# Patient Record
Sex: Female | Born: 1945 | Race: Black or African American | Hispanic: No | Marital: Single | Smoking: Current every day smoker
Health system: Southern US, Community
[De-identification: ages and names within clinical notes are randomized; demographics above are authoritative.]

## PROBLEM LIST (undated history)

## (undated) DIAGNOSIS — E119 Type 2 diabetes mellitus without complications: Secondary | ICD-10-CM

## (undated) DIAGNOSIS — F32A Depression, unspecified: Secondary | ICD-10-CM

## (undated) DIAGNOSIS — Z21 Asymptomatic human immunodeficiency virus [HIV] infection status: Secondary | ICD-10-CM

## (undated) DIAGNOSIS — I10 Essential (primary) hypertension: Secondary | ICD-10-CM

## (undated) DIAGNOSIS — I2510 Atherosclerotic heart disease of native coronary artery without angina pectoris: Secondary | ICD-10-CM

## (undated) DIAGNOSIS — F1910 Other psychoactive substance abuse, uncomplicated: Secondary | ICD-10-CM

## (undated) DIAGNOSIS — F329 Major depressive disorder, single episode, unspecified: Secondary | ICD-10-CM

## (undated) DIAGNOSIS — E7800 Pure hypercholesterolemia, unspecified: Secondary | ICD-10-CM

## (undated) DIAGNOSIS — B20 Human immunodeficiency virus [HIV] disease: Secondary | ICD-10-CM

## (undated) DIAGNOSIS — F1490 Cocaine use, unspecified, uncomplicated: Secondary | ICD-10-CM

## (undated) HISTORY — PX: OTHER SURGICAL HISTORY: SHX169

## (undated) HISTORY — DX: Other psychoactive substance abuse, uncomplicated: F19.10

---

## 2008-07-25 ENCOUNTER — Other Ambulatory Visit: Payer: Self-pay

## 2008-07-25 ENCOUNTER — Other Ambulatory Visit: Payer: Self-pay | Admitting: Emergency Medicine

## 2008-07-25 ENCOUNTER — Ambulatory Visit: Payer: Self-pay | Admitting: *Deleted

## 2008-07-26 ENCOUNTER — Other Ambulatory Visit: Payer: Self-pay | Admitting: Emergency Medicine

## 2008-07-26 ENCOUNTER — Inpatient Hospital Stay (HOSPITAL_COMMUNITY): Admission: RE | Admit: 2008-07-26 | Discharge: 2008-07-31 | Payer: Self-pay | Admitting: *Deleted

## 2011-03-30 NOTE — H&P (Signed)
Rachael Santana, RAMAKER                ACCOUNT NO.:  [id number]   MEDICAL RECORD NO.:  [id number]          PATIENT TYPE:  IPS   LOCATION:  0307                          FACILITY:  [facility]   PHYSICIAN:  Jasmine Pang, M.D. DATE OF BIRTH:  December 12, 1945   DATE OF ADMISSION:  07/26/2008  DATE OF DISCHARGE:  07/26/2008                       PSYCHIATRIC ADMISSION ASSESSMENT   This is a 65 year old female who was voluntarily admitted.   HISTORY OF PRESENT ILLNESS:  The patient reports a history of  depression.  Depression has been ongoing.  She is feeling very useless  and worthless.  She was having thoughts to jump in front of a car.  She  has not been taking her medications hoping something would happen to  her.  She has no energy, no interest in her activities of daily living,  has not been taking care of her personal hygiene, has not been eating as  she should for her history of diabetes.  She states she has been  thinking about her son who was killed several years ago.  Denies any  substance use.  Urine drug screen was positive for cocaine.   PAST PSYCHIATRIC HISTORY:  First admission to [facility].  Was hospitalized at [facility] approximately 30 years ago for  depression.  Has no current outpatient treatment in place.  Was on  Zoloft in the past.   SOCIAL HISTORY:  A 65 year old divorced female lives in [place] and  lives alone.  Has a daughter in [place] that is supportive.   FAMILY HISTORY:  Is none.   ALCOHOL AND DRUG HISTORY:  She denies any alcohol or drug use.  Again,  urine drug screen was positive for cocaine.   PRIMARY CARE Mahira Petras:  Has none.   MEDICAL PROBLEMS:  Are insulin dependent diabetes mellitus, chronic  pain, HIV positive and hypertension.   MEDICATIONS:  1. Listed is glipizide 10 mg daily.  2. Metformin 1000 mg b.i.d. before dinner and breakfast.  3. Zoloft 50 mg daily.  4. Pravastatin 20 mg daily.  5. Lisinopril 10 mg daily.  6. Novolin insulin 30 units b.i.d.  Again, the patient has been      noncompliant for some period of time.   DRUG ALLERGIES:  PENICILLIN.   PHYSICAL EXAMINATION:  This is a middle-aged woman somewhat overweight.  She was assessed in the emergency department.  She had elevated blood  sugars with initial blood sugar of 623 down to 389 prior to transfer.  The patient did receive IV fluids and regular insulin to control blood  sugars.  Her sodium is 129.  Alcohol level less than 5.  CBC was within  normal limits.  Urine drug screen is positive for cocaine.  Vital signs,  her temperature is 97.5, 72 heart rate, 16 respirations, blood pressure  is 155/88, 5 feet 7-1/2 inches tall, 204 pounds.  She is 96% saturated.  She appears in no acute physical distress.  She denies any complaints of  pain or other symptoms.   MENTAL STATUS EXAM:  The patient was resting in bed, easily awakened and  cooperative for the interview.  Sat on the side of the bed and provided  a good history.  She is casually dressed with good eye contact.  Her  speech is soft-spoken, clear.  The patient's mood is useless and  worthless.  The patient is sad, teary-eyed and tearful throughout the  interview at times.  Thought process are coherent.  Denies any current  suicidal thoughts.  Denies any hallucinations.  Cognitive function, the  patient is aware of herself and situation.  Memory is fair.  Judgment  and insight fair.   AXIS I:  Major depressive disorder, cocaine abuse.  AXIS II:  Deferred.  AXIS III:  Insulin dependent diabetes, hypertension, elevated  cholesterol, chronic pain, HIV positive.  AXIS IV:  Medical problems, psychosocial problems related to son's death  and isolation and lack of support.  AXIS V:  Current is 30.   PLAN:  To stabilize mood and thinking.  We will initiate Celexa, risks  and benefits were discussed with the patient.  The patient is agreeable.  We will have the diabetic coordinator  monitor blood sugars and provide  recommendations for blood sugars.  Will continue to gather more history,  identify supports.  Her tentative length of stay at this time is 4-6  days.      Landry Corporal, N.P.      Jasmine Pang, M.D.  Electronically Signed    JO/MEDQ  D:  07/26/2008  T:  07/27/2008  Job:  [id number]

## 2011-03-30 NOTE — Discharge Summary (Signed)
Rachael Santana, Rachael Santana                ACCOUNT NO.:  [id number]   MEDICAL RECORD NO.:  [id number]          PATIENT TYPE:  IPS   LOCATION:  0307                          FACILITY:  [facility]   PHYSICIAN:  Jasmine Pang, M.D. DATE OF BIRTH:  1946-01-22   DATE OF ADMISSION:  07/26/2008  DATE OF DISCHARGE:  07/31/2008                               DISCHARGE SUMMARY   IDENTIFICATION:  This is a 65 year old divorced African American female,  who was voluntarily admitted on July 26, 2008.   HISTORY OF PRESENT ILLNESS:  The patient reports a history of  depression.  Depression has been ongoing.  She is feeling very useless  and worthless.  She was having thoughts to jump in front of a car.  She  has not been taking her medications hoping something would happened to  her.  She has no energy.  No intracenter activities of daily living.  She has not been taking care for personal hygiene.  She has not been  eating as she should for her history of diabetes.  She states she has  been thinking about her son who was killed several years ago.  She  denies any substance use.  Urine drug screen was positive for cocaine.   PAST PSYCHIATRIC HISTORY:  This is the first admission to [facility].  She was hospitalized at [facility]  approximately 30 years ago for depression.  She has no current  outpatient treatment in place.  She was on Zoloft in the past.   FAMILY HISTORY:  None reported.   ALCOHOL AND DRUG HISTORY:  The patient denies any alcohol or drug use  again urine drug screen was positive for cocaine.   MEDICAL PROBLEMS:  Insulin-dependent diabetes mellitus, chronic pain,  HIV positive, and hypertension.   MEDICATIONS:  1. Glipizide 10 mg daily.  2. Metformin 1000 mg b.i.d. before dinner and breakfast.  3. Zoloft 50 mg daily.  4. Pravastatin 20 mg daily.  5. Lisinopril 10 mg daily.  6. Novolin insulin 30 units b.i.d. (again, the patient has been      noncompliant for  some period of time.)   DRUG ALLERGIES:  PENICILLIN.   PHYSICAL EXAMINATION:  There were no acute physical or medical problems  noted.  The patient was assessed in the emergency department at [facility] ED.   ADMISSION LABORATORIES:  UDS was positive for cocaine, glucose was 389,  alcohol level was less than 5, and sodium was 129.  CBC was within  normal limits.   HOSPITAL COURSE:  Upon admission, the patient was started on trazodone  50 mg p.o. q.h.s. p.r.n. insomnia may repeat x1 if needed.  She was also  started on a moderate sliding scale glycemic control protocol.  She was  restarted on her medicines glipizide 10 mg daily.  Metformin 1000 mg  b.i.d. before breakfast and dinner.  Sertraline 50 mg p.o. daily,  pravastatin 20 mg p.o. daily, lisinopril 10 mg p.o. daily, Novolin  insulin 30 units subcutaneously b.i.d.  She was also started on Lantus  insulin 30 units subcutaneously.  On July 26, 2008, 10 units of  NovoLog insulin was added t.i.d. with each full-meal in addition to the  sliding scale insulin.  The patient's glipizide was discontinued.  She  was started on NPH insulin 10 units subcutaneously daily.  On July 28, 2008, due to severe headache, the patient was started on Fioricet 1  every 6 hours as needed.  When this did not resolve her headache, she  was given a Vicodin 5 mg/325 mg one p.o. now.  On July 30, 2008,  when the headache returned, she was given Percocet 08/324 mg one p.o.  for headache.  She was also given Cipro 250 mg p.o. b.i.d. for 5 days.  In individual sessions with me, the patient was reserved, but  cooperative.  She was quite distressed, tearful, depressed, and anxious.  Speech was soft and slow.  There was psychomotor retardation.  She was  lying in bed much of the day.  She does not feel like attending unit  therapeutic groups and activities at first, this improved as the  hospitalization progressed.  She stated that she had not  been taking her  diabetes medications because she felt like I wanted to slip away.  She  stated she did not see a purpose for her being here.  She was in [facility] 4 years ago, she said for  depression.  As hospitalization progressed, she remained depressed,  anxious, and tearful.  She discussed the death of her son by gunshot in  1997.  She stated I feel empty.  She was not having any side effects  to her medications.  Sleep was good and appetite was good.  By July 29, 2008, mood was less depressed and less anxious.  She felt much  better.  She wanted a family session with her daughter, this was  arranged for July 31, 2008, she began to anticipate going home  after the family session.  The session went well and daughter was very  supportive of the patient, it was felt that the patient was ready for  discharge.  Sleep was good appetite was good.  Mood was euthymic.  Affect consistent with mood.  There was no suicidal or homicidal  ideation.  No thoughts of self-injurious behavior.  No auditory or  visual hallucinations.  No paranoia or delusions.  Thoughts were logical  and goal-directed.  Thought content, no predominant theme.  Cognitive  grossly intact.  Insight good.  Judgment good.  Impulse control was  good.  She was felt to be safe to be discharged and her daughter agreed  with this assessment.   DISCHARGE DIAGNOSES:  Axis I: Major depressive disorder, recurrent  severe and Cocaine abuse.  Axis II: None.  Axis III: Insulin-dependent diabetes mellitus.  Also, hypertension and  elevated cholesterol, chronic pain, and human immunodeficiency virus  positive.  Axis IV: Severe (medical problems, psychosocial problems related to  son's death, and isolation with lack of support.  Burden of psychiatric  illness).  Axis V: Global assessment of functioning was 50 upon discharge.  GAF was  40 upon admission.  GAF highest past year was 65-70.    DISCHARGE PLAN:  There is no specific activity level restriction.  Dietary restrictions were dictated by her primary care physician with  regards to her diabetes.   POSTHOSPITAL CARE PLANS:  The patient will see her primary care  physician Dr. Arther Dames on August 04, 2008, at 9:20 a.m.  She  will  also return to [facility] in [place] for followup medication  management.   DISCHARGE MEDICATIONS:  1. Celexa 20 mg daily.  2. Glucotrol 10 mg at 7:00 a.m. with a meal.  3. Glucophage 500 mg take 1 pill twice a day.  4. Lisinopril 10 mg daily.  5. Lantus 30 units subcutaneously at bedtime.  6. Pravastatin 20 mg daily.  7. Cipro 250 mg p.o. b.i.d. for 5 days.  8. Trazodone 50 mg at bedtime, may repeat for 1.   She is to talk to her doctor she has talked to Dr. Arther Dames about  resuming her Novolin and resuming other medications.  She is to call her  doctor, if she is having problems before her scheduled appointment.      Jasmine Pang, M.D.  Electronically Signed     BHS/MEDQ  D:  07/31/2008  T:  08/01/2008  Job:  [id number]

## 2011-03-30 NOTE — H&P (Signed)
NAMEMARBELLA, MARKGRAF                ACCOUNT NO.:  [id number]   MEDICAL RECORD NO.:  [id number]          PATIENT TYPE:  EMS   LOCATION:                               FACILITY:  [facility]   PHYSICIAN:  Vania Rea, M.D. DATE OF BIRTH:  08/12/1946   DATE OF ADMISSION:  07/25/2008  DATE OF DISCHARGE:                              HISTORY & PHYSICAL   PRIMARY CARE PHYSICIAN:  Unassigned.   CHIEF COMPLAINT:  Uncontrolled diabetes.   HISTORY OF THE PRESENT ILLNESS:  This is a 65 year old African American  lady with a history of diabetes, hypertension and depression who has not  been taking her medications apart from her insulin for about a month  now.  She has become increasingly depressed and was sent from the local  mental health outpatient department to the emergency room because of  suicidal ideation and recommendation for inpatient management.  The  patient was seen and evaluated by the emergency room physician, and is  considered mentally unstable and requires  acute psychiatric care.  However, because the patient's blood sugar was greater than 600 she was  not accepted by the psychiatric facility.  They recommended  stabilization of her blood glucose and monitoring for least 12 hours  prior to admission to a psychiatric facility.  The Hospitalist service  was called to assist with management.   The patient takes oral antidiabetic medications as well as insulin.  She  has continued to take her insulin, but has not been taking her metformin  nor her glipizide as prescribed.  She used to get her treatments out at  the [facility], but she has recently acquired  Medicare and Medicaid; and, because of this she no longer qualifies for  treatment at that clinic, and is looking around trying to find a primary  care physician.   For the past month she has had polyuria, polydipsia and episodic  shortness of breath, which is associated with her mild case of emphysema  she  feels.  She has not been using her inhaler either.  Apart from this,  the patient has had no other symptoms of an acute illness.  Mentally she  has been depressed and has had suicidal thoughts; and, she has been  abusing cocaine as a result.   PAST MEDICAL HISTORY:  1. Diabetes.  2. Hypertension.  3. Depression.  4. Mild emphysema.   MEDICATIONS:  1. NPH insulin 30 units every 12 hours.  2. The patient has been noncompliance with metformin 500 mg twice      daily,  3. Lisinopril 10 mg daily,  4. Pravastatin 20 mg daily,  5. Zoloft 50 mg daily,  6. Glipizide 10 mg daily; and,  7. Albuterol MDI 2 puffs twice daily as needed.   ALLERGIES:  Allergic to PENICILLIN.   SOCIAL HISTORY:  The patient smokes a half-a-pack per day.  Denies  alcohol use.  She has been abusing cocaine.  Her son died in the 107s  and recently he has been dominating her thoughts more and more.   FAMILY HISTORY:  The  patient's family history is significant for  diabetes on her father's side of the family, depression in her mother  and her daughter.   REVIEW OF SYSTEMS:  On review of systems other than that noted above a  10-point review of systems is significant only for bitemporal headaches.   PHYSICAL EXAMINATION:  GENERAL APPEARANCE:  On physical exam this is a  pleasant middle-aged African American lady lying on the stretcher in no  acute distress.  VITAL SIGNS:  Temperature is 98.2, pulse is 72, respirations are 17 and  blood pressure is 153/90.  She is saturating at 98% on room air.  GENERAL:  In general she is in no pain.  HEENT:  Pupils are round and equal.  Mucous membranes pink and  anicteric.  She looks somewhat dehydrated and has some perleche.  There  is no oral Candida.  NECK:  The neck shows no cervical lymphadenopathy, thyromegaly or  jugular venous distention.  CHEST:  The patient's chest is clear to auscultation bilaterally.  HEART:  Cardiovascular system reveals regular rhythm without  murmur.  ABDOMEN:  The patient's abdomen is obese, soft and nontender.  EXTREMITIES:  The extremities are without edema.  She has 2+ pulses  bilaterally.  NEUROLOGIC EXAMINATION:  The central nervous system shows that cranial  nerves II-XII are grossly intact.  She has no focal neurological  deficit.  SKIN:  On the skin she has a healing, scaly rash on both hands and  elbows as well as her feet for which she states she has been using a  cream, but cannot remember the name of it.   LABORATORY DATA:  The patient's CBC is unremarkable.  Her serum  chemistries are remarkable for a sodium of 129, potassium of 4.3,  chloride of 96, CO2 of 26, BUN of 20, creatinine of 1.0, and glucose of  623.  Alcohol level was undetectable.  Urine drug screen was positive  for cocaine.  The patient had been on a glucommander in the emergency  room and her blood sugar at this time is now decreased to 146.   ASSESSMENT:  1. Depression with suicidal ideation.  2. Diabetes Type 2, uncontrolled.  3. History of chronic obstructive pulmonary disease, stable.  4. History of hyperlipidemia, unknown control.  5. Hypertension, uncontrolled.   PLAN:  1. Can bring this lady in on observation and for hydration, if this      process is not completed in the ED.  2. We will restart her insulin at a higher dose than she has been      taking.  3. We will also restart her metformin.  4. As soon as she is Stable she can be transferred to [facility].      Vania Rea, M.D.  Electronically Signed     LC/MEDQ  D:  07/26/2008  T:  07/26/2008  Job:  [id number]

## 2011-08-16 LAB — GLUCOSE, CAPILLARY: Glucose-Capillary: 207 — ABNORMAL HIGH

## 2011-08-18 LAB — URINALYSIS, ROUTINE W REFLEX MICROSCOPIC
Nitrite: NEGATIVE
Protein, ur: NEGATIVE
Specific Gravity, Urine: 1.024
Urobilinogen, UA: 0.2

## 2011-08-18 LAB — PREGNANCY, URINE: Preg Test, Ur: NEGATIVE

## 2011-08-18 LAB — DIFFERENTIAL
Basophils Relative: 0
Eosinophils Absolute: 0
Eosinophils Relative: 0
Lymphs Abs: 1.2
Neutrophils Relative %: 68

## 2011-08-18 LAB — GLUCOSE, CAPILLARY
Glucose-Capillary: 101 — ABNORMAL HIGH
Glucose-Capillary: 146 — ABNORMAL HIGH
Glucose-Capillary: 213 — ABNORMAL HIGH
Glucose-Capillary: 227 — ABNORMAL HIGH
Glucose-Capillary: 230 — ABNORMAL HIGH
Glucose-Capillary: 250 — ABNORMAL HIGH
Glucose-Capillary: 283 — ABNORMAL HIGH
Glucose-Capillary: 316 — ABNORMAL HIGH
Glucose-Capillary: 389 — ABNORMAL HIGH
Glucose-Capillary: 406 — ABNORMAL HIGH
Glucose-Capillary: 410 — ABNORMAL HIGH
Glucose-Capillary: 499 — ABNORMAL HIGH

## 2011-08-18 LAB — POCT I-STAT, CHEM 8
Creatinine, Ser: 1
HCT: 39
Hemoglobin: 13.3
Potassium: 4.3
Sodium: 129 — ABNORMAL LOW
TCO2: 26

## 2011-08-18 LAB — CBC
HCT: 39.4
MCHC: 33.1
MCV: 87.7
Platelets: 216
RDW: 13.7
WBC: 4.7

## 2011-08-18 LAB — BASIC METABOLIC PANEL
BUN: 15
Calcium: 8.9
Creatinine, Ser: 0.78
GFR calc non Af Amer: >60
Glucose, Bld: 339 — ABNORMAL HIGH
Sodium: 134 — ABNORMAL LOW

## 2011-08-18 LAB — RAPID URINE DRUG SCREEN, HOSP PERFORMED
Amphetamines: NOT DETECTED
Barbiturates: NOT DETECTED
Benzodiazepines: NOT DETECTED
Cocaine: POSITIVE — AB
Opiates: NOT DETECTED

## 2011-08-18 LAB — TSH: TSH: 0.65

## 2011-08-18 LAB — URINE MICROSCOPIC-ADD ON

## 2013-09-06 ENCOUNTER — Encounter (HOSPITAL_COMMUNITY): Payer: Self-pay | Admitting: Emergency Medicine

## 2013-09-06 ENCOUNTER — Emergency Department (HOSPITAL_COMMUNITY): Payer: Medicare Other

## 2013-09-06 ENCOUNTER — Observation Stay (HOSPITAL_COMMUNITY)
Admission: EM | Admit: 2013-09-06 | Discharge: 2013-09-07 | Disposition: A | Discharge status: Home / Self Care | Payer: Medicare Other | Attending: Emergency Medicine | Admitting: Emergency Medicine

## 2013-09-06 DIAGNOSIS — R0789 Other chest pain: Principal | ICD-10-CM | POA: Insufficient documentation

## 2013-09-06 DIAGNOSIS — Z79899 Other long term (current) drug therapy: Secondary | ICD-10-CM | POA: Insufficient documentation

## 2013-09-06 DIAGNOSIS — E7800 Pure hypercholesterolemia, unspecified: Secondary | ICD-10-CM | POA: Diagnosis present

## 2013-09-06 DIAGNOSIS — R0600 Dyspnea, unspecified: Secondary | ICD-10-CM

## 2013-09-06 DIAGNOSIS — I2510 Atherosclerotic heart disease of native coronary artery without angina pectoris: Secondary | ICD-10-CM

## 2013-09-06 DIAGNOSIS — Z21 Asymptomatic human immunodeficiency virus [HIV] infection status: Secondary | ICD-10-CM | POA: Insufficient documentation

## 2013-09-06 DIAGNOSIS — Z9861 Coronary angioplasty status: Secondary | ICD-10-CM | POA: Insufficient documentation

## 2013-09-06 DIAGNOSIS — E119 Type 2 diabetes mellitus without complications: Secondary | ICD-10-CM | POA: Insufficient documentation

## 2013-09-06 DIAGNOSIS — F329 Major depressive disorder, single episode, unspecified: Secondary | ICD-10-CM | POA: Insufficient documentation

## 2013-09-06 DIAGNOSIS — R0602 Shortness of breath: Secondary | ICD-10-CM | POA: Insufficient documentation

## 2013-09-06 DIAGNOSIS — F3289 Other specified depressive episodes: Secondary | ICD-10-CM | POA: Insufficient documentation

## 2013-09-06 DIAGNOSIS — Z7902 Long term (current) use of antithrombotics/antiplatelets: Secondary | ICD-10-CM | POA: Insufficient documentation

## 2013-09-06 DIAGNOSIS — I10 Essential (primary) hypertension: Secondary | ICD-10-CM | POA: Diagnosis present

## 2013-09-06 DIAGNOSIS — B20 Human immunodeficiency virus [HIV] disease: Secondary | ICD-10-CM

## 2013-09-06 DIAGNOSIS — Z880 Allergy status to penicillin: Secondary | ICD-10-CM | POA: Insufficient documentation

## 2013-09-06 DIAGNOSIS — F17200 Nicotine dependence, unspecified, uncomplicated: Secondary | ICD-10-CM | POA: Insufficient documentation

## 2013-09-06 DIAGNOSIS — Z23 Encounter for immunization: Secondary | ICD-10-CM | POA: Insufficient documentation

## 2013-09-06 DIAGNOSIS — M25569 Pain in unspecified knee: Secondary | ICD-10-CM | POA: Insufficient documentation

## 2013-09-06 DIAGNOSIS — R079 Chest pain, unspecified: Secondary | ICD-10-CM | POA: Diagnosis present

## 2013-09-06 DIAGNOSIS — Z794 Long term (current) use of insulin: Secondary | ICD-10-CM | POA: Insufficient documentation

## 2013-09-06 DIAGNOSIS — E1140 Type 2 diabetes mellitus with diabetic neuropathy, unspecified: Secondary | ICD-10-CM

## 2013-09-06 DIAGNOSIS — R110 Nausea: Secondary | ICD-10-CM | POA: Insufficient documentation

## 2013-09-06 HISTORY — DX: Type 2 diabetes mellitus without complications: E11.9

## 2013-09-06 HISTORY — DX: Cocaine use, unspecified, uncomplicated: F14.90

## 2013-09-06 HISTORY — DX: Atherosclerotic heart disease of native coronary artery without angina pectoris: I25.10

## 2013-09-06 HISTORY — DX: Essential (primary) hypertension: I10

## 2013-09-06 HISTORY — DX: Pure hypercholesterolemia, unspecified: E78.00

## 2013-09-06 HISTORY — DX: Human immunodeficiency virus (HIV) disease: B20

## 2013-09-06 HISTORY — DX: Major depressive disorder, single episode, unspecified: F32.9

## 2013-09-06 HISTORY — DX: Asymptomatic human immunodeficiency virus (hiv) infection status: Z21

## 2013-09-06 HISTORY — DX: Depression, unspecified: F32.A

## 2013-09-06 LAB — CBC WITH DIFFERENTIAL/PLATELET
Basophils Absolute: 0 10*3/uL (ref 0.0–0.1)
Eosinophils Absolute: 0 10*3/uL (ref 0.0–0.7)
Eosinophils Relative: 1 % (ref 0–5)
MCH: 31.5 pg (ref 26.0–34.0)
MCV: 93.1 fL (ref 78.0–100.0)
Neutrophils Relative %: 64 % (ref 43–77)
Platelets: 262 10*3/uL (ref 150–400)
RDW: 13.6 % (ref 11.5–15.5)
WBC: 5.6 10*3/uL (ref 4.0–10.5)

## 2013-09-06 LAB — COMPREHENSIVE METABOLIC PANEL
ALT: 11 U/L (ref 0–35)
AST: 14 U/L (ref 0–37)
Albumin: 3 g/dL — ABNORMAL LOW (ref 3.5–5.2)
Calcium: 9.2 mg/dL (ref 8.4–10.5)
GFR calc Af Amer: 41 mL/min — ABNORMAL LOW (ref >90)
Potassium: 4.4 mEq/L (ref 3.5–5.1)
Sodium: 137 mEq/L (ref 135–145)
Total Protein: 7.6 g/dL (ref 6.0–8.3)

## 2013-09-06 MED ORDER — AMITRIPTYLINE HCL 25 MG PO TABS
25.0000 mg | ORAL_TABLET | Freq: Every day | ORAL | Status: DC
  Administered 2013-09-07: 25 mg via ORAL
  Filled 2013-09-06 (×2): qty 1

## 2013-09-06 MED ORDER — OXYCODONE-ACETAMINOPHEN 5-325 MG PO TABS
1.0000 | ORAL_TABLET | Freq: Once | ORAL | Status: AC
  Administered 2013-09-06: 1 via ORAL
  Filled 2013-09-06: qty 1

## 2013-09-06 MED ORDER — ENOXAPARIN SODIUM 120 MG/0.8ML ~~LOC~~ SOLN
1.0000 mg/kg | Freq: Two times a day (BID) | SUBCUTANEOUS | Status: DC
  Administered 2013-09-07: 105 mg via SUBCUTANEOUS
  Filled 2013-09-06 (×3): qty 0.8

## 2013-09-06 MED ORDER — RITONAVIR 100 MG PO CAPS
100.0000 mg | ORAL_CAPSULE | Freq: Every day | ORAL | Status: DC
  Administered 2013-09-07: 100 mg via ORAL
  Filled 2013-09-06 (×2): qty 1

## 2013-09-06 MED ORDER — DARUNAVIR ETHANOLATE 800 MG PO TABS
800.0000 mg | ORAL_TABLET | Freq: Every day | ORAL | Status: DC
  Administered 2013-09-07: 800 mg via ORAL
  Filled 2013-09-06 (×2): qty 1

## 2013-09-06 MED ORDER — SODIUM CHLORIDE 0.9 % IV SOLN: 250.0000 mL | INTRAVENOUS | Status: DC | PRN

## 2013-09-06 MED ORDER — SODIUM CHLORIDE 0.9 % IJ SOLN
3.0000 mL | Freq: Two times a day (BID) | INTRAMUSCULAR | Status: DC
  Administered 2013-09-07: 3 mL via INTRAVENOUS

## 2013-09-06 MED ORDER — GABAPENTIN 300 MG PO CAPS
600.0000 mg | ORAL_CAPSULE | Freq: Three times a day (TID) | ORAL | Status: DC
  Administered 2013-09-07 (×2): 600 mg via ORAL
  Filled 2013-09-06 (×4): qty 2

## 2013-09-06 MED ORDER — INSULIN ASPART 100 UNIT/ML ~~LOC~~ SOLN: 0.0000 [IU] | Freq: Three times a day (TID) | SUBCUTANEOUS | Status: DC

## 2013-09-06 MED ORDER — SIMVASTATIN 40 MG PO TABS
40.0000 mg | ORAL_TABLET | Freq: Every day | ORAL | Status: DC
  Filled 2013-09-06: qty 1

## 2013-09-06 MED ORDER — ONDANSETRON HCL 4 MG/2ML IJ SOLN: 4.0000 mg | Freq: Four times a day (QID) | INTRAMUSCULAR | Status: DC | PRN

## 2013-09-06 MED ORDER — FUROSEMIDE 20 MG PO TABS
20.0000 mg | ORAL_TABLET | Freq: Every day | ORAL | Status: DC
  Administered 2013-09-07: 20 mg via ORAL
  Filled 2013-09-06: qty 1

## 2013-09-06 MED ORDER — INSULIN GLARGINE 100 UNIT/ML ~~LOC~~ SOLN
60.0000 [IU] | Freq: Two times a day (BID) | SUBCUTANEOUS | Status: DC
  Administered 2013-09-07: 60 [IU] via SUBCUTANEOUS
  Filled 2013-09-06 (×3): qty 0.6

## 2013-09-06 MED ORDER — CARVEDILOL 25 MG PO TABS
25.0000 mg | ORAL_TABLET | Freq: Two times a day (BID) | ORAL | Status: DC
  Administered 2013-09-07: 25 mg via ORAL
  Filled 2013-09-06 (×3): qty 1

## 2013-09-06 MED ORDER — SODIUM CHLORIDE 0.9 % IJ SOLN: 3.0000 mL | INTRAMUSCULAR | Status: DC | PRN

## 2013-09-06 MED ORDER — BUDESONIDE-FORMOTEROL FUMARATE 160-4.5 MCG/ACT IN AERO: 2.0000 | INHALATION_SPRAY | Freq: Two times a day (BID) | RESPIRATORY_TRACT | Status: DC | PRN

## 2013-09-06 MED ORDER — ALUM & MAG HYDROXIDE-SIMETH 200-200-20 MG/5ML PO SUSP
30.0000 mL | Freq: Four times a day (QID) | ORAL | Status: DC | PRN
  Administered 2013-09-06: 30 mL via ORAL
  Filled 2013-09-06: qty 30

## 2013-09-06 MED ORDER — ASPIRIN 81 MG PO CHEW
324.0000 mg | CHEWABLE_TABLET | Freq: Once | ORAL | Status: AC
  Administered 2013-09-06: 324 mg via ORAL
  Filled 2013-09-06: qty 4

## 2013-09-06 MED ORDER — EMTRICITABINE-TENOFOVIR DF 200-300 MG PO TABS
1.0000 | ORAL_TABLET | Freq: Every day | ORAL | Status: DC
  Administered 2013-09-07: 1 via ORAL
  Filled 2013-09-06: qty 1

## 2013-09-06 MED ORDER — CHLORTHALIDONE 25 MG PO TABS
25.0000 mg | ORAL_TABLET | Freq: Every day | ORAL | Status: DC
  Administered 2013-09-07: 25 mg via ORAL
  Filled 2013-09-06: qty 1

## 2013-09-06 MED ORDER — MORPHINE SULFATE 2 MG/ML IJ SOLN
1.0000 mg | INTRAMUSCULAR | Status: DC | PRN
  Administered 2013-09-06: 1 mg via INTRAVENOUS
  Filled 2013-09-06: qty 1

## 2013-09-06 MED ORDER — CLOPIDOGREL BISULFATE 75 MG PO TABS
75.0000 mg | ORAL_TABLET | Freq: Every day | ORAL | Status: DC
  Administered 2013-09-07: 75 mg via ORAL
  Filled 2013-09-06: qty 1

## 2013-09-06 MED ORDER — NITROGLYCERIN 0.4 MG SL SUBL
0.4000 mg | SUBLINGUAL_TABLET | SUBLINGUAL | Status: AC | PRN
  Administered 2013-09-06 (×3): 0.4 mg via SUBLINGUAL

## 2013-09-06 MED ORDER — ALBUTEROL SULFATE HFA 108 (90 BASE) MCG/ACT IN AERS: 2.0000 | INHALATION_SPRAY | RESPIRATORY_TRACT | Status: DC | PRN

## 2013-09-06 MED ORDER — ONDANSETRON HCL 4 MG PO TABS: 4.0000 mg | ORAL_TABLET | Freq: Four times a day (QID) | ORAL | Status: DC | PRN

## 2013-09-06 MED ORDER — GLIPIZIDE ER 10 MG PO TB24
10.0000 mg | ORAL_TABLET | Freq: Every day | ORAL | Status: DC
  Filled 2013-09-06 (×2): qty 1

## 2013-09-06 MED ORDER — RAMIPRIL 10 MG PO CAPS
10.0000 mg | ORAL_CAPSULE | Freq: Every day | ORAL | Status: DC
  Administered 2013-09-07: 10 mg via ORAL
  Filled 2013-09-06: qty 1

## 2013-09-06 NOTE — H&P (Signed)
PCP:   TALBOT, Chivas Notz C, MD   Chief Complaint:  Chest pain  HPI: 67 yo female h/o hiv, cad s/p stent x 2 1.5 years ago, htn, dm, hld comes in with 3 days of waxing/waning sscp with radiation down left arm.  This feels similar to her previous mi event.  No fevers.  No cough.  Some associated sob and nausea but no vomiting.  Not relieved or worse with eating.  Relieved witih asa and ntg given earlier.  Cp free now.  No le edema or swelling.  She has been hiv pos for over 10 years with undetectable viral load for many many years per her report.  Review of Systems:  Positive and negative as per HPI otherwise all other systems are negative  Past Medical History: Past Medical History  Diagnosis Date  . Hypertension   . Diabetes mellitus without complication   . HIV (human immunodeficiency virus infection)   . High cholesterol   . Depression    Past Surgical History  Procedure Laterality Date  . Stents      Medications: Prior to Admission medications   Medication Sig Start Date End Date Taking? Authorizing Provider  amitriptyline (ELAVIL) 25 MG tablet Take 25 mg by mouth at bedtime.  08/23/13  Yes Historical Provider, MD  carvedilol (COREG) 25 MG tablet Take 25 mg by mouth 2 (two) times daily with a meal.  08/23/13  Yes Historical Provider, MD  chlorthalidone (HYGROTON) 25 MG tablet Take 25 mg by mouth daily.  08/23/13  Yes Historical Provider, MD  clopidogrel (PLAVIX) 75 MG tablet Take 75 mg by mouth daily.  09/03/13  Yes Historical Provider, MD  emtricitabine-tenofovir (TRUVADA) 200-300 MG per tablet Take 1 tablet by mouth daily.   Yes Historical Provider, MD  furosemide (LASIX) 20 MG tablet Take 20 mg by mouth daily.  08/23/13  Yes Historical Provider, MD  gabapentin (NEURONTIN) 300 MG capsule Take 600 mg by mouth 3 (three) times daily.  08/23/13  Yes Historical Provider, MD  GLIPIZIDE XL 10 MG 24 hr tablet Take 10 mg by mouth daily.  08/31/13  Yes Historical Provider, MD  JANUVIA 100 MG  tablet Take 100 mg by mouth daily.  08/23/13  Yes Historical Provider, MD  LANTUS 100 UNIT/ML injection Inject 60 Units into the skin 2 (two) times daily.  08/23/13  Yes Historical Provider, MD  pravastatin (PRAVACHOL) 80 MG tablet Take 80 mg by mouth daily.  08/23/13  Yes Historical Provider, MD  PREZISTA 800 MG tablet Take 800 mg by mouth daily with breakfast.  08/23/13  Yes Historical Provider, MD  PROAIR HFA 108 (90 BASE) MCG/ACT inhaler Inhale 2 puffs into the lungs every 4 (four) hours as needed for wheezing or shortness of breath.  08/23/13  Yes Historical Provider, MD  ramipril (ALTACE) 10 MG capsule Take 10 mg by mouth daily.  08/23/13  Yes Historical Provider, MD  ritonavir (NORVIR) 100 MG capsule Take 100 mg by mouth daily.    Yes Historical Provider, MD  SYMBICORT 160-4.5 MCG/ACT inhaler Inhale 2 puffs into the lungs 2 (two) times daily as needed (shortness of breath).  08/23/13  Yes Historical Provider, MD    Allergies:   Allergies  Allergen Reactions  . Penicillins Hives    Social History:  reports that she has quit smoking. She does not have any smokeless tobacco history on file. Her alcohol and drug histories are not on file.  Family History: History reviewed. No pertinent family history.  Physical Exam: Filed Vitals:   09/06/13 2100 09/06/13 2130 09/06/13 2145 09/06/13 2215  BP: 163/64 182/68 173/64 163/66  Pulse: 72 67 67 71  Temp:      TempSrc:      Resp: 13 16 15 17   Height:      Weight:      SpO2: 100% 100% 100% 100%   General appearance: alert, cooperative and no distress Head: Normocephalic, without obvious abnormality, atraumatic Eyes: negative Nose: Nares normal. Septum midline. Mucosa normal. No drainage or sinus tenderness. Neck: no JVD and supple, symmetrical, trachea midline Lungs: clear to auscultation bilaterally Heart: regular rate and rhythm, S1, S2 normal, no murmur, click, rub or gallop chest pain reproducible with palp left anterior chest  wall Abdomen: soft, non-tender; bowel sounds normal; no masses,  no organomegaly Extremities: extremities normal, atraumatic, no cyanosis or edema Pulses: 2+ and symmetric Skin: Skin color, texture, turgor normal. No rashes or lesions Neurologic: Grossly normal   Labs on Admission:   Recent Labs  09/06/13 2008  NA 137  K 4.4  CL 97  CO2 30  GLUCOSE 254*  BUN 17  CREATININE 1.50*  CALCIUM 9.2    Recent Labs  09/06/13 2008  AST 14  ALT 11  ALKPHOS 77  BILITOT 0.1*  PROT 7.6  ALBUMIN 3.0*    Recent Labs  09/06/13 2008  WBC 5.6  NEUTROABS 3.6  HGB 13.7  HCT 40.5  MCV 93.1  PLT 262   Radiological Exams on Admission: Dg Chest 2 View  09/06/2013   CLINICAL DATA:  Left-sided chest pain.  EXAM: CHEST  2 VIEW  COMPARISON:  None available.  FINDINGS: The cardiac silhouette, mediastinal and hilar contours are within normal limits. The lungs are clear except for streaky bibasilar atelectasis. No pleural effusion. Moderate eventration of the left hemidiaphragm is noted. The bony thorax is intact.  IMPRESSION: Streaky bibasilar atelectasis but no infiltrates or effusions.   Electronically Signed   By: Loralie Champagne M.D.   On: 09/06/2013 20:48    Assessment/Plan  67 yo female chest pain with atycial and typical features with h/o CAD s/p stenting  Principal Problem:   Chest pain-  Cp free now.  obs on tele.  Romi.  Ck echo in am.  She did not have her cath here in 2013.  If cp recurs, place ntg paste.  Give full dose of lovenox x 1 until rules out.  Active Problems:   Hypertension   HIV (human immunodeficiency virus infection)   Diabetes mellitus without complication   High cholesterol   CAD S/P percutaneous coronary angioplasty    Mika Griffitts A 09/06/2013, 10:25 PM

## 2013-09-06 NOTE — ED Provider Notes (Signed)
CSN: [id number]     Arrival date & time 09/06/13  1950 History   First MD Initiated Contact with Patient 09/06/13 2031     Chief Complaint  Patient presents with  . Chest Pain  . Knee Pain    Patient is a 67 y.o. female presenting with chest pain and knee pain. The history is provided by the patient.  Chest Pain Pain location:  L chest Pain quality: pressure   Pain radiates to:  L shoulder Pain radiates to the back: no   Pain severity:  Moderate Onset quality:  Gradual Duration:  2 days Timing:  Constant Progression:  Worsening Chronicity:  New Relieved by:  None tried Worsened by:  Nothing tried Associated symptoms: nausea and shortness of breath   Associated symptoms: no fever and not vomiting   Knee Pain Associated symptoms: no fever     Past Medical History  Diagnosis Date  . Hypertension   . Diabetes mellitus without complication   . HIV (human immunodeficiency virus infection)   . High cholesterol   . Depression    Past Surgical History  Procedure Laterality Date  . Stents     History reviewed. No pertinent family history. History  Substance Use Topics  . Smoking status: Former Games developer  . Smokeless tobacco: Not on file  . Alcohol Use: Not on file   OB History   Grav Para Term Preterm Abortions TAB SAB Ect Mult Living                 Review of Systems  Constitutional: Negative for fever.  Respiratory: Positive for shortness of breath.   Cardiovascular: Positive for chest pain.  Gastrointestinal: Positive for nausea. Negative for vomiting and diarrhea.  Neurological: Negative for syncope.  All other systems reviewed and are negative.    Allergies  Penicillins  Home Medications   Current Outpatient Rx  Name  Route  Sig  Dispense  Refill  . amitriptyline (ELAVIL) 25 MG tablet   Oral   Take 25 mg by mouth at bedtime.          . carvedilol (COREG) 25 MG tablet   Oral   Take 25 mg by mouth 2 (two) times daily with a meal.          .  chlorthalidone (HYGROTON) 25 MG tablet   Oral   Take 25 mg by mouth daily.          . clopidogrel (PLAVIX) 75 MG tablet   Oral   Take 75 mg by mouth daily.          Marland Kitchen emtricitabine-tenofovir (TRUVADA) 200-300 MG per tablet   Oral   Take 1 tablet by mouth daily.         . furosemide (LASIX) 20 MG tablet   Oral   Take 20 mg by mouth daily.          Marland Kitchen gabapentin (NEURONTIN) 300 MG capsule   Oral   Take 600 mg by mouth 3 (three) times daily.          Marland Kitchen GLIPIZIDE XL 10 MG 24 hr tablet   Oral   Take 10 mg by mouth daily.          Marland Kitchen JANUVIA 100 MG tablet   Oral   Take 100 mg by mouth daily.          Marland Kitchen LANTUS 100 UNIT/ML injection   Subcutaneous   Inject 60 Units into the skin 2 (two) times daily.          Marland Kitchen  pravastatin (PRAVACHOL) 80 MG tablet   Oral   Take 80 mg by mouth daily.          Marland Kitchen PREZISTA 800 MG tablet   Oral   Take 800 mg by mouth daily with breakfast.          . PROAIR HFA 108 (90 BASE) MCG/ACT inhaler   Inhalation   Inhale 2 puffs into the lungs every 4 (four) hours as needed for wheezing or shortness of breath.          . ramipril (ALTACE) 10 MG capsule   Oral   Take 10 mg by mouth daily.          . ritonavir (NORVIR) 100 MG capsule   Oral   Take 100 mg by mouth daily.          . SYMBICORT 160-4.5 MCG/ACT inhaler   Inhalation   Inhale 2 puffs into the lungs 2 (two) times daily as needed (shortness of breath).           BP 178/69  Pulse 74  Temp(Src) 98.9 F (37.2 C) (Oral)  Resp 16  Ht 5\' 5"  (1.651 m)  Wt 235 lb (106.595 kg)  BMI 39.11 kg/m2  SpO2 98% Physical Exam CONSTITUTIONAL: Well developed/well nourished HEAD: Normocephalic/atraumatic EYES: EOMI/PERRL ENMT: Mucous membranes moist NECK: supple no meningeal signs SPINE:entire spine nontender CV: S1/S2 noted, no murmurs/rubs/gallops noted LUNGS: Lungs are clear to auscultation bilaterally, no apparent distress Chest-  No significant chest wall  tenderness ABDOMEN: soft, nontender, no rebound or guarding GU:no cva tenderness NEURO: Pt is awake/alert, moves all extremitiesx4 EXTREMITIES: pulses normal, full ROM. Mild tenderness to left knee, but no signs of trauma or deformity (pt reports h/o arthritis in that leg) SKIN: warm, color normal PSYCH: no abnormalities of mood noted  ED Course  Procedures (including critical care time) Labs Review Labs Reviewed  COMPREHENSIVE METABOLIC PANEL - Abnormal; Notable for the following:    Glucose, Bld 254 (*)    Creatinine, Ser 1.50 (*)    Albumin 3.0 (*)    Total Bilirubin 0.1 (*)    GFR calc non Af Amer 35 (*)    GFR calc Af Amer 41 (*)    All other components within normal limits  CBC WITH DIFFERENTIAL  POCT I-STAT TROPONIN I   Imaging Review Dg Chest 2 View  09/06/2013   CLINICAL DATA:  Left-sided chest pain.  EXAM: CHEST  2 VIEW  COMPARISON:  None available.  FINDINGS: The cardiac silhouette, mediastinal and hilar contours are within normal limits. The lungs are clear except for streaky bibasilar atelectasis. No pleural effusion. Moderate eventration of the left hemidiaphragm is noted. The bony thorax is intact.  IMPRESSION: Streaky bibasilar atelectasis but no infiltrates or effusions.   Electronically Signed   By: Loralie Champagne M.D.   On: 09/06/2013 20:48    EKG Interpretation     Ventricular Rate:  75 PR Interval:  152 QRS Duration: 78 QT Interval:  422 QTC Calculation: 471 R Axis:   7 Text Interpretation:  Normal sinus rhythm Inferior infarct , age undetermined Anterior infarct , age undetermined Abnormal ECG No previous ECGs available Poor data quality nml intervals            MDM  No diagnosis found. Nursing notes including past medical history and social history reviewed and considered in documentation xrays reviewed and considered Labs/vital reviewed and considered   9:49 PM Pt with h/o CAD with stent placement in 2013  in [facility] She reports this  feels similar to prior episodes of cardiac CP   10:41 PM Pt reports she is now CP free Will admit to medicine D/w dr Onalee Hua, will admit  Joya Gaskins, MD 09/06/13 2241

## 2013-09-06 NOTE — ED Notes (Signed)
Pt states that yesterday she was having mild CP. Pt states today the pain got worse radiated down her right shoulder and left side of her chest. Pt states that she has had 2 stents placed and the pain today feels similar to when she had her last stents placed. Pt states that she also has althritis in both knees and her left knee has been hurting more than usual. Pt wants CP and knee pain checked out today.

## 2013-09-07 ENCOUNTER — Encounter (HOSPITAL_COMMUNITY): Payer: Self-pay | Admitting: Nurse Practitioner

## 2013-09-07 DIAGNOSIS — E1149 Type 2 diabetes mellitus with other diabetic neurological complication: Secondary | ICD-10-CM

## 2013-09-07 DIAGNOSIS — E7800 Pure hypercholesterolemia, unspecified: Secondary | ICD-10-CM

## 2013-09-07 DIAGNOSIS — R0609 Other forms of dyspnea: Secondary | ICD-10-CM

## 2013-09-07 DIAGNOSIS — R079 Chest pain, unspecified: Secondary | ICD-10-CM

## 2013-09-07 DIAGNOSIS — E1142 Type 2 diabetes mellitus with diabetic polyneuropathy: Secondary | ICD-10-CM

## 2013-09-07 DIAGNOSIS — I10 Essential (primary) hypertension: Secondary | ICD-10-CM

## 2013-09-07 LAB — BASIC METABOLIC PANEL
CO2: 31 mEq/L (ref 19–32)
Chloride: 100 mEq/L (ref 96–112)
Creatinine, Ser: 1.35 mg/dL — ABNORMAL HIGH (ref 0.50–1.10)
Glucose, Bld: 74 mg/dL (ref 70–99)

## 2013-09-07 LAB — TROPONIN I
Troponin I: <0.3 ng/mL (ref <0.30)
Troponin I: <0.3 ng/mL (ref <0.30)

## 2013-09-07 LAB — CBC
HCT: 38 % (ref 36.0–46.0)
MCH: 30.9 pg (ref 26.0–34.0)
MCHC: 33.2 g/dL (ref 30.0–36.0)
MCV: 93.1 fL (ref 78.0–100.0)
Platelets: 237 10*3/uL (ref 150–400)
RBC: 4.08 MIL/uL (ref 3.87–5.11)
RDW: 13.7 % (ref 11.5–15.5)
WBC: 5.3 10*3/uL (ref 4.0–10.5)

## 2013-09-07 LAB — GLUCOSE, CAPILLARY
Glucose-Capillary: 182 mg/dL — ABNORMAL HIGH (ref 70–99)
Glucose-Capillary: 58 mg/dL — ABNORMAL LOW (ref 70–99)
Glucose-Capillary: 98 mg/dL (ref 70–99)
Glucose-Capillary: 108 mg/dL — ABNORMAL HIGH (ref 70–99)

## 2013-09-07 LAB — HEPATIC FUNCTION PANEL
AST: 12 U/L (ref 0–37)
Alkaline Phosphatase: 70 U/L (ref 39–117)
Total Bilirubin: 0.1 mg/dL — ABNORMAL LOW (ref 0.3–1.2)

## 2013-09-07 LAB — LIPASE, BLOOD: Lipase: 30 U/L (ref 11–59)

## 2013-09-07 MED ORDER — INSULIN GLARGINE 100 UNIT/ML ~~LOC~~ SOLN
30.0000 [IU] | Freq: Two times a day (BID) | SUBCUTANEOUS | Status: DC
  Filled 2013-09-07: qty 0.3

## 2013-09-07 MED ORDER — OXYCODONE-ACETAMINOPHEN 5-325 MG PO TABS: 1.0000 | ORAL_TABLET | Freq: Once | ORAL | Status: DC

## 2013-09-07 MED ORDER — HEPARIN SODIUM (PORCINE) 5000 UNIT/ML IJ SOLN
5000.0000 [IU] | Freq: Three times a day (TID) | INTRAMUSCULAR | Status: DC
  Filled 2013-09-07: qty 1

## 2013-09-07 MED ORDER — PRAVASTATIN SODIUM 40 MG PO TABS
80.0000 mg | ORAL_TABLET | Freq: Every day | ORAL | Status: DC
  Filled 2013-09-07: qty 2

## 2013-09-07 MED ORDER — OMEPRAZOLE 20 MG PO CPDR: 20.0000 mg | DELAYED_RELEASE_CAPSULE | Freq: Every day | ORAL | Status: DC

## 2013-09-07 MED ORDER — AMLODIPINE BESYLATE 5 MG PO TABS: 5.0000 mg | ORAL_TABLET | Freq: Every day | ORAL | Status: DC

## 2013-09-07 MED ORDER — INFLUENZA VAC SPLIT QUAD 0.5 ML IM SUSP
0.5000 mL | INTRAMUSCULAR | Status: AC
  Administered 2013-09-07: 0.5 mL via INTRAMUSCULAR

## 2013-09-07 MED ORDER — DEXTROSE 50 % IV SOLN
INTRAVENOUS | Status: AC
  Administered 2013-09-07: 50 mL
  Filled 2013-09-07: qty 50

## 2013-09-07 MED ORDER — DEXTROSE-NACL 5-0.9 % IV SOLN: INTRAVENOUS | Status: DC

## 2013-09-07 MED ORDER — OXYCODONE HCL 5 MG PO TABS: 5.0000 mg | ORAL_TABLET | ORAL | Status: DC | PRN

## 2013-09-07 MED ORDER — AMLODIPINE BESYLATE 5 MG PO TABS
5.0000 mg | ORAL_TABLET | Freq: Every day | ORAL | Status: DC
  Administered 2013-09-07: 5 mg via ORAL
  Filled 2013-09-07: qty 1

## 2013-09-07 MED ORDER — PANTOPRAZOLE SODIUM 40 MG PO TBEC
40.0000 mg | DELAYED_RELEASE_TABLET | Freq: Every day | ORAL | Status: DC
  Administered 2013-09-07: 40 mg via ORAL
  Filled 2013-09-07: qty 1

## 2013-09-07 NOTE — Progress Notes (Signed)
[facility] PROGRESS NOTE  Rachael Santana RUE:[id number] DOB: [birth year] DOA: 09/06/2013  PCP: Mia Creek, MD  Brief HPI: 67 yo female h/o hiv, cad s/p stent x 2 1.5 years ago, htn, dm, hld comes in with 3 days of waxing/waning sscp with radiation down left arm. 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 No fevers. No cough. Some associated SOB and nausea but no vomiting. Not relieved or worse with eating. Relieved with asa and ntg given earlier. She has been HIV pos for over 10 years with undetectable viral load for many years per her report.   Past medical history:  Past Medical History  Diagnosis Date  . Hypertension   . Diabetes mellitus without complication   . HIV (human immunodeficiency virus infection)   . High cholesterol   . Depression     Consultants: [facility] Cards  Procedures: ECHO pending  Antibiotics: None  Subjective: Patient still with some left armpit pain. Some discomfort over her chest and upper abdomen. Was short of breath yesterday but not now. Complains of neuropathic pain in legs.  Objective: Vital Signs  Filed Vitals:   09/06/13 2215 09/06/13 2229 09/06/13 2344 09/07/13 0557  BP: 163/66 163/66 182/72 160/63  Pulse: 71 76 66 61  Temp:   98.5 F (36.9 C) 98 F (36.7 C)  TempSrc:   Oral Oral  Resp: 17 17 18 18   Height:   5\' 5"  (1.651 m)   Weight:   111.857 kg (246 lb 9.6 oz)   SpO2: 100% 97% 99% 100%   No intake or output data in the 24 hours ending 09/07/13 1140 Filed Weights   09/06/13 1959 09/06/13 2344  Weight: 106.595 kg (235 lb) 111.857 kg (246 lb 9.6 oz)    General appearance: alert, cooperative, appears stated age and no distress Head: Normocephalic, without obvious abnormality, atraumatic Resp: clear to auscultation bilaterally Cardio: regular rate and rhythm, S1, S2 normal, no murmur, click, rub or gallop GI: Slightly tender epigastric area. No rebound rigidity. No masses or organomegaly Extremities: minimal edema LE Neurologic:  Alert and oriented x 3. No focal deficits.  Lab Results:  Basic Metabolic Panel:  Recent Labs Lab 09/06/13 2008 09/07/13 0610  NA 137 137  K 4.4 4.8  CL 97 100  CO2 30 31  GLUCOSE 254* 74  BUN 17 19  CREATININE 1.50* 1.35*  CALCIUM 9.2 8.9   Liver Function Tests:  Recent Labs Lab 09/06/13 2008  AST 14  ALT 11  ALKPHOS 77  BILITOT 0.1*  PROT 7.6  ALBUMIN 3.0*   No results found for this basename: LIPASE, AMYLASE,  in the last 168 hours No results found for this basename: AMMONIA,  in the last 168 hours CBC:  Recent Labs Lab 09/06/13 2008 09/07/13 0610  WBC 5.6 5.3  NEUTROABS 3.6  --   HGB 13.7 12.6  HCT 40.5 38.0  MCV 93.1 93.1  PLT 262 237   Cardiac Enzymes:  Recent Labs Lab 09/07/13 09/07/13 0610  TROPONINI <0.30 <0.30   BNP (last 3 results) No results found for this basename: PROBNP,  in the last 8760 hours CBG:  Recent Labs Lab 09/06/13 2331 09/07/13 0738 09/07/13 0842  GLUCAP 182* 58* 98    Studies/Results: Dg Chest 2 View  09/06/2013   CLINICAL DATA:  Left-sided chest pain.  EXAM: CHEST  2 VIEW  COMPARISON:  None available.  FINDINGS: The cardiac silhouette, mediastinal and hilar contours are within normal limits. The lungs are clear except for  streaky bibasilar atelectasis. No pleural effusion. Moderate eventration of the left hemidiaphragm is noted. The bony thorax is intact.  IMPRESSION: Streaky bibasilar atelectasis but no infiltrates or effusions.   Electronically Signed   By: Loralie Champagne M.D.   On: 09/06/2013 20:48    Medications:  Scheduled: . amitriptyline  25 mg Oral QHS  . amLODipine  5 mg Oral Daily  . carvedilol  25 mg Oral BID WC  . chlorthalidone  25 mg Oral Daily  . clopidogrel  75 mg Oral Q breakfast  . Darunavir Ethanolate  800 mg Oral Q breakfast  . emtricitabine-tenofovir  1 tablet Oral Daily  . enoxaparin (LOVENOX) injection  1 mg/kg Subcutaneous Q12H  . furosemide  20 mg Oral Daily  . gabapentin  600 mg  Oral TID  . [START ON 09/08/2013] influenza vac split quadrivalent PF  0.5 mL Intramuscular Tomorrow-1000  . insulin aspart  0-9 Units Subcutaneous TID WC  . insulin glargine  30 Units Subcutaneous BID  . oxyCODONE-acetaminophen  1 tablet Oral Once  . pantoprazole  40 mg Oral Q1200  . pravastatin  80 mg Oral q1800  . ramipril  10 mg Oral Daily  . ritonavir  100 mg Oral Q breakfast  . sodium chloride  3 mL Intravenous Q12H   Continuous: . dextrose 5 % and 0.9% NaCl     ZOX:WRUEAV chloride, albuterol, alum & mag hydroxide-simeth, budesonide-formoterol, morphine injection, ondansetron (ZOFRAN) IV, ondansetron, sodium chloride  Assessment/Plan:  Principal Problem:   Chest pain Active Problems:   Hypertension   HIV (human immunodeficiency virus infection)   Diabetes mellitus without complication   High cholesterol   CAD S/P percutaneous coronary angioplasty    Chest Pain in setting of Known CAD Seems to be ruling out. Still with symptoms. Pain appears atypical but she does have known coronary artery disease. Could also be GI in origin. Will start PPI. Check LFT and Lipase. Request records from [facility] where she her cath and stents. Doesn't know name of her cardiologist. On Plavix. Will consult cardiology at this time. NPO for now.  DM2, uncontrolled Hold Lantus as NPO. Reduce dose from this evening. Hypoglycemia noted.  HTN Poorly controlled. Add Amlodipine. Continue other agents  Diabetic Neuropathy On Neurontin  Tobacco Abuse Counseled.  Elevated Creatinine Likely has CKD. Monitor closely. Noted she is on ACEI.  History of HIV On HAART. Per patient she has undetectable viral load. Can follow up as OP.  Code Status: Full Code  DVT Prophylaxis: received full dose lovenox last night. Start Heparin SQ from AM. Family Communication: Discussed with patient  Disposition Plan: Home when stable    LOS: 1 day   [facility]  [facility] Pager  [phone] 09/07/2013, 11:40 AM  If 8PM-8AM, please contact night-coverage at [email or web address], password [facility]

## 2013-09-07 NOTE — Discharge Summary (Signed)
[facility]  Physician Discharge Summary   Patient ID: Rachael Santana MRN: [id number] DOB/AGE: [birth year] 67 y.o.  Admit date: 09/06/2013 Discharge date: 09/07/2013  PCP: Mia Creek, MD  DISCHARGE DIAGNOSES:  Principal Problem:   Chest pain Active Problems:   Hypertension   HIV (human immunodeficiency virus infection)   Diabetes mellitus without complication   High cholesterol   CAD S/P percutaneous coronary angioplasty   RECOMMENDATIONS FOR OUTPATIENT FOLLOW UP: 1. Patient has follow up with cardiology 2. She will need close follow up for HTN  DISCHARGE CONDITION: fair  Diet recommendation: Mod Carb  Filed Weights   09/06/13 1959 09/06/13 2344  Weight: 106.595 kg (235 lb) 111.857 kg (246 lb 9.6 oz)    INITIAL HISTORY: 67 yo female h/o hiv, cad s/p stent x 2 1.5 years ago, htn, dm, hld comes in with 3 days of waxing/waning sscp with radiation down left arm. 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 No fevers. No cough. Some associated SOB and nausea but no vomiting. Not relieved or worse with eating. Relieved with asa and ntg given earlier. She has been HIV pos for over 10 years with undetectable viral load for many years per her report  Consultations:  LB Cards  Procedures:  None  HOSPITAL COURSE:   Chest Pain in setting of Known CAD  She has ruled out for ACS. Pain is mostly atypical. But since she has history of CAD she was seen by cardiology and was cleared for discharge. They will arrange outpatient follow up. Continue current medications. PPI trial. Lipase and LFT are pending and will need to wait for same.   DM2, uncontrolled  May resume home medication regimen.   HTN  Poorly controlled. Added Amlodipine. Continue other agents. Will need close follow up.  Diabetic Neuropathy  On Neurontin   Tobacco Abuse  Counseled.   Elevated Creatinine  Likely has CKD. Needs to closely monitored as outpatient.   History of HIV  On HAART. Per patient  she has undetectable viral load. Can follow up as OP.  She remains stable. If Lipase and LFT's are unremarkable she may be discharged.  PERTINENT LABS:  The results of significant diagnostics from this hospitalization (including imaging, microbiology, ancillary and laboratory) are listed below for reference.     Labs: Basic Metabolic Panel:  Recent Labs Lab 09/06/13 2008 09/07/13 0610  NA 137 137  K 4.4 4.8  CL 97 100  CO2 30 31  GLUCOSE 254* 74  BUN 17 19  CREATININE 1.50* 1.35*  CALCIUM 9.2 8.9   Liver Function Tests:  Recent Labs Lab 09/06/13 2008  AST 14  ALT 11  ALKPHOS 77  BILITOT 0.1*  PROT 7.6  ALBUMIN 3.0*   CBC:  Recent Labs Lab 09/06/13 2008 09/07/13 0610  WBC 5.6 5.3  NEUTROABS 3.6  --   HGB 13.7 12.6  HCT 40.5 38.0  MCV 93.1 93.1  PLT 262 237   Cardiac Enzymes:  Recent Labs Lab 09/07/13 09/07/13 0610  TROPONINI <0.30 <0.30   CBG:  Recent Labs Lab 09/06/13 2331 09/07/13 0738 09/07/13 0842 09/07/13 1136  GLUCAP 182* 58* 98 108*     IMAGING STUDIES Dg Chest 2 View  09/06/2013   CLINICAL DATA:  Left-sided chest pain.  EXAM: CHEST  2 VIEW  COMPARISON:  None available.  FINDINGS: The cardiac silhouette, mediastinal and hilar contours are within normal limits. The lungs are clear except for streaky bibasilar atelectasis. No pleural effusion. Moderate eventration of the  left hemidiaphragm is noted. The bony thorax is intact.  IMPRESSION: Streaky bibasilar atelectasis but no infiltrates or effusions.   Electronically Signed   By: Loralie Champagne M.D.   On: 09/06/2013 20:48   DISPOSITION: Home  Discharge Orders   Future Appointments Provider Department Dept Phone   09/20/2013 10:00 AM Pricilla Riffle, MD [facility] [phone]   Future Orders Complete By Expires   Diet Carb Modified  As directed    Discharge instructions  As directed    Comments:     Please follow up with the cardiologist as explained. Monitor  blood sugars as before.   Increase activity slowly  As directed       ALLERGIES:  Allergies  Allergen Reactions  . Penicillins Hives    Current Discharge Medication List    START taking these medications   Details  amLODipine (NORVASC) 5 MG tablet Take 1 tablet (5 mg total) by mouth daily. Qty: 30 tablet, Refills: 1    omeprazole (PRILOSEC) 20 MG capsule Take 1 capsule (20 mg total) by mouth daily. Qty: 30 capsule, Refills: 0    oxyCODONE (OXY IR/ROXICODONE) 5 MG immediate release tablet Take 1 tablet (5 mg total) by mouth every 4 (four) hours as needed for pain. Qty: 10 tablet, Refills: 0      CONTINUE these medications which have NOT CHANGED   Details  amitriptyline (ELAVIL) 25 MG tablet Take 25 mg by mouth at bedtime.     carvedilol (COREG) 25 MG tablet Take 25 mg by mouth 2 (two) times daily with a meal.     chlorthalidone (HYGROTON) 25 MG tablet Take 25 mg by mouth daily.     clopidogrel (PLAVIX) 75 MG tablet Take 75 mg by mouth daily.     emtricitabine-tenofovir (TRUVADA) 200-300 MG per tablet Take 1 tablet by mouth daily.    furosemide (LASIX) 20 MG tablet Take 20 mg by mouth daily.     gabapentin (NEURONTIN) 300 MG capsule Take 600 mg by mouth 3 (three) times daily.     GLIPIZIDE XL 10 MG 24 hr tablet Take 10 mg by mouth daily.     JANUVIA 100 MG tablet Take 100 mg by mouth daily.     LANTUS 100 UNIT/ML injection Inject 60 Units into the skin 2 (two) times daily.     pravastatin (PRAVACHOL) 80 MG tablet Take 80 mg by mouth daily.     PREZISTA 800 MG tablet Take 800 mg by mouth daily with breakfast.     PROAIR HFA 108 (90 BASE) MCG/ACT inhaler Inhale 2 puffs into the lungs every 4 (four) hours as needed for wheezing or shortness of breath.     ramipril (ALTACE) 10 MG capsule Take 10 mg by mouth daily.     ritonavir (NORVIR) 100 MG capsule Take 100 mg by mouth daily.     SYMBICORT 160-4.5 MCG/ACT inhaler Inhale 2 puffs into the lungs 2 (two) times daily  as needed (shortness of breath).        Follow-up Information   Follow up with Dietrich Pates, MD On 09/20/2013. (10:00)    Specialty:  Cardiology   Contact information:   [address] [place] [address] [phone]       Follow up with TALBOT, DAVID C, MD. Schedule an appointment as soon as possible for a visit in 1 week.   Specialty:  Internal Medicine   Contact information:   [address]  [place] [address] [phone]       TOTAL DISCHARGE TIME: 35 mins  [facility]  [facility] Pager [phone]  09/07/2013, 1:24 PM

## 2013-09-07 NOTE — Consult Note (Signed)
CARDIOLOGY CONSULT NOTE   Patient ID: Rachael Santana MRN: [id number], DOB/AGE: Dec 16, 1945   Admit date: 09/06/2013 Date of Consult: 09/07/2013  Primary Physician: Mia Creek, MD Primary Cardiologist: new to [facility]  Pt. Profile  67 y/o female with h/o CAD s/p MI and stenting x 2 in 01/2013 @ [facility] who presented to the hospital yesterday with a 2 day h/o constant left arm/axillary and chest pain.  Problem List  Past Medical History  Diagnosis Date  . Hypertension   . Diabetes mellitus without complication   . HIV (human immunodeficiency virus infection)   . High cholesterol   . Depression   . CAD (coronary artery disease)     a. s/p stenting x 2 in 01/2013 @ [place]  . Crack cocaine use     Past Surgical History  Procedure Laterality Date  . Stents       Allergies  Allergies  Allergen Reactions  . Penicillins Hives    HPI   67 y/o female with h/o of CAD s/p MI and PCI with 2 stents placed in 01/2013 @ [facility].  Those records are not available at this time.  She says that following her MI, she felt generally well and saw cardiology twice post-hospitalization but hasn't followed up in over a year.  Over the past year, she has noted DOE when walking 50 yards or less at a normal pace but she had not been experiencing chest pain.  Sometime in the past few months, she relapsed on crack cocaine but says that that was a one time occurrence.  She moved to [place] approximately 2 wks ago to live with her dtr.  She has been going for ~ 30 min walks twice/wk with doe as outlined above.  Approx 3 days ago, she began to experience constant left arm, left axillary, and substernal/left chest discomfort/soreness, that is worse with position changes and palpation.  Because of ongoing symptoms, she presented to the emergency department at [facility] in October 23. There, ECG showed old inferior and anterior infarcts without acute ST or T changes. Troponin was normal. She was  admitted for further evaluation. She has since ruled out for myocardial infarction though she continues to have a reproducible left chest and arm discomfort. We have been asked to evaluate.  Inpatient Medications  . amitriptyline  25 mg Oral QHS  . amLODipine  5 mg Oral Daily  . carvedilol  25 mg Oral BID WC  . chlorthalidone  25 mg Oral Daily  . clopidogrel  75 mg Oral Q breakfast  . Darunavir Ethanolate  800 mg Oral Q breakfast  . emtricitabine-tenofovir  1 tablet Oral Daily  . furosemide  20 mg Oral Daily  . gabapentin  600 mg Oral TID  . [START ON 09/08/2013] heparin subcutaneous  5,000 Units Subcutaneous Q8H  . [START ON 09/08/2013] influenza vac split quadrivalent PF  0.5 mL Intramuscular Tomorrow-1000  . insulin aspart  0-9 Units Subcutaneous TID WC  . insulin glargine  30 Units Subcutaneous BID  . oxyCODONE-acetaminophen  1 tablet Oral Once  . pantoprazole  40 mg Oral Q1200  . pravastatin  80 mg Oral q1800  . ramipril  10 mg Oral Daily  . ritonavir  100 mg Oral Q breakfast  . sodium chloride  3 mL Intravenous Q12H    Family History Family History  Problem Relation Age of Onset  . Diabetes Other     'runs in father's family.'  . Other  Mother     alive and well @ 64.     Social History History   Social History  . Marital Status: Single    Spouse Name: N/A    Number of Children: N/A  . Years of Education: N/A   Occupational History  . Not on file.   Social History Main Topics  . Smoking status: Current Every Day Smoker -- 0.50 packs/day for 40 years  . Smokeless tobacco: Not on file  . Alcohol Use: Yes     Comment: rare  . Drug Use: Yes     Comment: has used crack cocaine in past few months (08/2013)  . Sexual Activity: Not on file   Other Topics Concern  . Not on file   Social History Narrative   Was living in [place] but moved to [place] to live w/ her dtr in early 08/2013.  She does not routinely exercise or adhere to any particular diet.      Review of Systems  General:  No chills, fever, night sweats or weight changes.  Cardiovascular:  Positive chest pain and dyspnea on exertion. No edema, orthopnea, palpitations, paroxysmal nocturnal dyspnea. Dermatological: No rash, lesions/masses Respiratory: No cough, positive dyspnea Urologic: No hematuria, dysuria Abdominal:   No nausea, vomiting, diarrhea, bright red blood per rectum, melena, or hematemesis Neurologic:  No visual changes, wkns, changes in mental status. All other systems reviewed and are otherwise negative except as noted above.  Physical Exam  Blood pressure 160/63, pulse 61, temperature 98 F (36.7 C), temperature source Oral, resp. rate 18, height 5\' 5"  (1.651 m), weight 246 lb 9.6 oz (111.857 kg), SpO2 100.00%.  General: Pleasant, NAD Psych: Normal affect. Neuro: Alert and oriented X 3. Moves all extremities spontaneously. HEENT: Normal  Neck: Supple without bruits or JVD. Lungs:  Resp regular and unlabored, CTA. Heart: RRR no s3, s4, or murmurs. Abdomen: Soft, non-tender, non-distended, BS + x 4.  Extremities: No clubbing, cyanosis, trace bilateral lower extremity edema. DP/PT/Radials 2+ and equal bilaterally.  Labs   Recent Labs  09/07/13 09/07/13 0610  TROPONINI <0.30 <0.30   Lab Results  Component Value Date   WBC 5.3 09/07/2013   HGB 12.6 09/07/2013   HCT 38.0 09/07/2013   MCV 93.1 09/07/2013   PLT 237 09/07/2013     Recent Labs Lab 09/06/13 2008 09/07/13 0610  NA 137 137  K 4.4 4.8  CL 97 100  CO2 30 31  BUN 17 19  CREATININE 1.50* 1.35*  CALCIUM 9.2 8.9  PROT 7.6  --   BILITOT 0.1*  --   ALKPHOS 77  --   ALT 11  --   AST 14  --   GLUCOSE 254* 74   Radiology/Studies  Dg Chest 2 View  09/06/2013   CLINICAL DATA:  Left-sided chest pain.  EXAM: CHEST  2 VIEW  COMPARISON:  None available.  FINDINGS: The cardiac silhouette, mediastinal and hilar contours are within normal limits. The lungs are clear except for streaky  bibasilar atelectasis. No pleural effusion. Moderate eventration of the left hemidiaphragm is noted. The bony thorax is intact.  IMPRESSION: Streaky bibasilar atelectasis but no infiltrates or effusions.   Electronically Signed   By: Loralie Champagne M.D.   On: 09/06/2013 20:48   ECG  Rsr, 75, inf q's, q's v4 - no old for comparison.  ASSESSMENT AND PLAN  1. Chest pain/coronary artery disease: Patient presented with a 2 to three-day history of constant substernal, left chest, left  axillary, and left arm pain that was worse with position changes and palpation. Despite prolonged symptoms, she has no objective evidence of ischemia. She also reports somewhat chronic dyspnea on exertion. In the absence of objective evidence of ischemia, we feel that she is stable for discharge today. We will arrange for followup in our office at that time consider outpatient echocardiogram and stress testing. Continue current medical regimen.  2. Hypertension: Blood pressure elevated this morning. We'll followup as an outpatient. Continue current meds.  3. Hyperlipidemia: Continue statin therapy.  4. Polysubstance abuse: Patient continues to smoke cigarettes and has used crack cocaine within the past 3-6 months. Complete cessation advised.  5. HIV: She is outpatient infectious disease followup.  6. Presumed Stage III chronic kidney disease: She is on an ACE inhibitor at home along with 2 different diuretics. This will need ongoing outpatient followup.  7. Diabetes mellitus: She'll be outpatient primary care follow.  Signed, Nicolasa Ducking, NP 09/07/2013, 12:53 PM  Patient seeen and examined  I have amended note above to reflect my findings.   Patient with some dyspnea on exertion but I am not convinced of signif change. Chest and arm pain do not appear to be cardiac. I think it is OK to d/c with outpatient f/u  Will get records from [facility].   Congratulated patient on smoking cessation  Needs to refrain from  crack.  Dietrich Pates

## 2013-09-07 NOTE — Progress Notes (Signed)
Utilization review completed.  

## 2013-09-20 ENCOUNTER — Encounter: Payer: Medicare Other | Admitting: Internal Medicine

## 2013-10-04 ENCOUNTER — Encounter: Payer: Self-pay | Admitting: Internal Medicine

## 2013-10-04 ENCOUNTER — Ambulatory Visit (INDEPENDENT_AMBULATORY_CARE_PROVIDER_SITE_OTHER): Payer: PRIVATE HEALTH INSURANCE | Admitting: Internal Medicine

## 2013-10-04 VITALS — BP 158/77 | HR 70 | Ht 64.75 in | Wt 247.0 lb

## 2013-10-04 DIAGNOSIS — E785 Hyperlipidemia, unspecified: Secondary | ICD-10-CM

## 2013-10-04 DIAGNOSIS — B20 Human immunodeficiency virus [HIV] disease: Secondary | ICD-10-CM

## 2013-10-04 DIAGNOSIS — I10 Essential (primary) hypertension: Secondary | ICD-10-CM

## 2013-10-04 DIAGNOSIS — E119 Type 2 diabetes mellitus without complications: Secondary | ICD-10-CM

## 2013-10-04 NOTE — Progress Notes (Addendum)
HPI Patient is a 67 yo with a history of CAD (s/p MI with PTCA/stent x 2 in March 2013 at [facility]  Cath showed LAD 100% mid; LCx 65% mid:  RCA 39%  Ramus 40%dz; LVEF 35% with anteroapical hypokinesis  Patient underwent DES (Xience) x2.)  Echo in July 2013 LVEF was 50%  She was recently d/c'd from [facility]   I saw her on consult  She had vary atypical patin in L arm/axilla that was constant.  She r/o from MI Since d/c she has been doing good  She denies CP  Breathing is OK  She does get tired often but this has been going on for a long time.  No change.   Allergies  Allergen Reactions  . Penicillins Hives    Current Outpatient Prescriptions  Medication Sig Dispense Refill  . amitriptyline (ELAVIL) 25 MG tablet Take 25 mg by mouth at bedtime.       . carvedilol (COREG) 25 MG tablet Take 25 mg by mouth 2 (two) times daily with a meal.       . chlorthalidone (HYGROTON) 25 MG tablet Take 25 mg by mouth daily.       . clopidogrel (PLAVIX) 75 MG tablet Take 75 mg by mouth daily.       Marland Kitchen emtricitabine-tenofovir (TRUVADA) 200-300 MG per tablet Take 1 tablet by mouth daily.      Marland Kitchen FLUoxetine (PROZAC) 20 MG tablet 20 mg.      . furosemide (LASIX) 20 MG tablet Take 20 mg by mouth daily.       Marland Kitchen gabapentin (NEURONTIN) 300 MG capsule Take 600 mg by mouth 3 (three) times daily.       Marland Kitchen GLIPIZIDE XL 10 MG 24 hr tablet Take 10 mg by mouth daily.       Marland Kitchen JANUVIA 100 MG tablet Take 100 mg by mouth daily.       Marland Kitchen LANTUS 100 UNIT/ML injection Inject 60 Units into the skin 2 (two) times daily.       Marland Kitchen omeprazole (PRILOSEC) 20 MG capsule Take 1 capsule (20 mg total) by mouth daily.  30 capsule  0  . pravastatin (PRAVACHOL) 80 MG tablet Take 80 mg by mouth daily.       Marland Kitchen PREZISTA 800 MG tablet Take 800 mg by mouth daily with breakfast.       . PROAIR HFA 108 (90 BASE) MCG/ACT inhaler Inhale 2 puffs into the lungs every 4 (four) hours as needed for wheezing or shortness of breath.       . ramipril (ALTACE) 10 MG  capsule Take 10 mg by mouth daily.       . ritonavir (NORVIR) 100 MG capsule Take 100 mg by mouth daily.       . SYMBICORT 160-4.5 MCG/ACT inhaler Inhale 2 puffs into the lungs 2 (two) times daily as needed (shortness of breath).       Marland Kitchen amLODipine (NORVASC) 5 MG tablet Take 1 tablet (5 mg total) by mouth daily.  30 tablet  1  . oxyCODONE (OXY IR/ROXICODONE) 5 MG immediate release tablet Take 1 tablet (5 mg total) by mouth every 4 (four) hours as needed for pain.  10 tablet  0   No current facility-administered medications for this visit.    Past Medical History  Diagnosis Date  . Hypertension   . Diabetes mellitus without complication   . HIV (human immunodeficiency virus infection)   . High cholesterol   .  Depression   . CAD (coronary artery disease)     a. s/p stenting x 2 in 01/2013 @ [facility]  . Crack cocaine use     Past Surgical History  Procedure Laterality Date  . Stents      Family History  Problem Relation Age of Onset  . Diabetes Other     'runs in father's family.'  . Other Mother     alive and well @ 32.    History   Social History  . Marital Status: Single    Spouse Name: N/A    Number of Children: N/A  . Years of Education: N/A   Occupational History  . Not on file.   Social History Main Topics  . Smoking status: Current Every Day Smoker -- 0.50 packs/day for 40 years  . Smokeless tobacco: Not on file  . Alcohol Use: Yes     Comment: rare  . Drug Use: Yes     Comment: has used crack cocaine in past few months (08/2013)  . Sexual Activity: Not on file   Other Topics Concern  . Not on file   Social History Narrative   Was living in [place] but moved to [place] to live w/ her dtr in early 08/2013.  She does not routinely exercise or adhere to any particular diet.    Review of Systems:  All systems reviewed.  They are negative to the above problem except as previously stated.  Vital Signs: BP 158/77  Pulse 70  Ht 5' 4.75" (1.645 m)  Wt  247 lb (112.038 kg)  BMI 41.40 kg/m2  Physical Exam Pateint is a morbidly obese 67 yo in NAD  HEENT:  Normocephalic, atraumatic. EOMI, PERRLA.  Neck: JVP is normal.  No bruits.  Lungs: clear to auscultation. No rales no wheezes.  Heart: Regular rate and rhythm. Normal S1, S2. No S3.   No significant murmurs. PMI not displaced.  Abdomen:  Supple, nontender. Normal bowel sounds. No masses. No hepatomegaly.  Extremities:   Good distal pulses throughout. No lower extremity edema.  Musculoskeletal :moving all extremities.  Neuro:   alert and oriented x3.  CN II-XII grossly intact.   Assessment and Plan:  1.  CP  Resolved  2.  CAD  Will have patient sign release to get info from [facility]  3.  HTN  BP is increased  Follow  No changes now.    4.  HL  Will need t oget recors  5.  HIV    6.  DM.

## 2013-10-04 NOTE — Patient Instructions (Signed)
Your physician wants you to follow-up in:  April 2015 You will receive a reminder letter in the mail two months in advance. If you don't receive a letter, please call our office to schedule the follow-up appointment.

## 2013-10-08 ENCOUNTER — Telehealth: Payer: Self-pay | Admitting: Internal Medicine

## 2013-10-08 NOTE — Telephone Encounter (Signed)
ROI faxed to [facility] at [phone]   10/08/13/KM

## 2013-11-12 ENCOUNTER — Telehealth: Payer: Self-pay | Admitting: Internal Medicine

## 2013-11-12 NOTE — Telephone Encounter (Signed)
Records rec From [facility] hold For Rachael Santana 11/12/13/KM

## 2013-11-23 ENCOUNTER — Emergency Department (HOSPITAL_COMMUNITY): Payer: Medicare Other

## 2013-11-23 ENCOUNTER — Encounter (HOSPITAL_COMMUNITY): Payer: Self-pay | Admitting: Emergency Medicine

## 2013-11-23 ENCOUNTER — Emergency Department (HOSPITAL_COMMUNITY)
Admission: EM | Admit: 2013-11-23 | Discharge: 2013-11-23 | Disposition: A | Discharge status: Home / Self Care | Payer: Medicare Other | Attending: Emergency Medicine | Admitting: Emergency Medicine

## 2013-11-23 DIAGNOSIS — Y92009 Unspecified place in unspecified non-institutional (private) residence as the place of occurrence of the external cause: Secondary | ICD-10-CM | POA: Insufficient documentation

## 2013-11-23 DIAGNOSIS — Y9389 Activity, other specified: Secondary | ICD-10-CM | POA: Insufficient documentation

## 2013-11-23 DIAGNOSIS — F3289 Other specified depressive episodes: Secondary | ICD-10-CM | POA: Insufficient documentation

## 2013-11-23 DIAGNOSIS — F329 Major depressive disorder, single episode, unspecified: Secondary | ICD-10-CM | POA: Insufficient documentation

## 2013-11-23 DIAGNOSIS — Z21 Asymptomatic human immunodeficiency virus [HIV] infection status: Secondary | ICD-10-CM | POA: Insufficient documentation

## 2013-11-23 DIAGNOSIS — Z794 Long term (current) use of insulin: Secondary | ICD-10-CM | POA: Insufficient documentation

## 2013-11-23 DIAGNOSIS — Z79899 Other long term (current) drug therapy: Secondary | ICD-10-CM | POA: Insufficient documentation

## 2013-11-23 DIAGNOSIS — S335XXA Sprain of ligaments of lumbar spine, initial encounter: Secondary | ICD-10-CM | POA: Insufficient documentation

## 2013-11-23 DIAGNOSIS — I2510 Atherosclerotic heart disease of native coronary artery without angina pectoris: Secondary | ICD-10-CM | POA: Insufficient documentation

## 2013-11-23 DIAGNOSIS — IMO0002 Reserved for concepts with insufficient information to code with codable children: Secondary | ICD-10-CM | POA: Insufficient documentation

## 2013-11-23 DIAGNOSIS — E119 Type 2 diabetes mellitus without complications: Secondary | ICD-10-CM | POA: Insufficient documentation

## 2013-11-23 DIAGNOSIS — S39012A Strain of muscle, fascia and tendon of lower back, initial encounter: Secondary | ICD-10-CM

## 2013-11-23 DIAGNOSIS — F17200 Nicotine dependence, unspecified, uncomplicated: Secondary | ICD-10-CM | POA: Insufficient documentation

## 2013-11-23 DIAGNOSIS — W010XXA Fall on same level from slipping, tripping and stumbling without subsequent striking against object, initial encounter: Secondary | ICD-10-CM | POA: Insufficient documentation

## 2013-11-23 DIAGNOSIS — Z9861 Coronary angioplasty status: Secondary | ICD-10-CM | POA: Insufficient documentation

## 2013-11-23 DIAGNOSIS — Z880 Allergy status to penicillin: Secondary | ICD-10-CM | POA: Insufficient documentation

## 2013-11-23 DIAGNOSIS — S8391XA Sprain of unspecified site of right knee, initial encounter: Secondary | ICD-10-CM

## 2013-11-23 DIAGNOSIS — E7800 Pure hypercholesterolemia, unspecified: Secondary | ICD-10-CM | POA: Insufficient documentation

## 2013-11-23 DIAGNOSIS — I10 Essential (primary) hypertension: Secondary | ICD-10-CM | POA: Insufficient documentation

## 2013-11-23 DIAGNOSIS — Z7902 Long term (current) use of antithrombotics/antiplatelets: Secondary | ICD-10-CM | POA: Insufficient documentation

## 2013-11-23 MED ORDER — HYDROCODONE-ACETAMINOPHEN 5-325 MG PO TABS: 1.0000 | ORAL_TABLET | Freq: Four times a day (QID) | ORAL | Status: DC | PRN

## 2013-11-23 MED ORDER — NAPROXEN 500 MG PO TABS: 500.0000 mg | ORAL_TABLET | Freq: Two times a day (BID) | ORAL | Status: DC

## 2013-11-23 MED ORDER — HYDROCODONE-ACETAMINOPHEN 5-325 MG PO TABS
1.0000 | ORAL_TABLET | Freq: Once | ORAL | Status: AC
  Administered 2013-11-23: 1 via ORAL
  Filled 2013-11-23: qty 1

## 2013-11-23 NOTE — Discharge Instructions (Signed)
Take naprosyn for pain. Norco for severe pain. Avoid walking on that leg. Use crutches. Keep elevated. Ice several times a day. Follow up with your primary care doctor or orthopedics specialist as referred.   Knee Sprain A knee sprain is a tear in one of the strong, fibrous tissues that connect the bones (ligaments) in your knee. The severity of the sprain depends on how much of the ligament is torn. The tear can be either partial or complete. CAUSES  Often, sprains are a result of a fall or injury. The force of the impact causes the fibers of your ligament to stretch too much. This excess tension causes the fibers of your ligament to tear. SYMPTOMS  You may have some loss of motion in your knee. Other symptoms include:  Bruising.  Tenderness.  Swelling. DIAGNOSIS  In order to diagnose knee sprain, your caregiver will physically examine your knee to determine how torn the ligament is. Your caregiver may also suggest an X-ray exam of your knee to make sure no bones are broken. TREATMENT  If your ligament is only partially torn, treatment usually involves keeping the knee in a fixed position (immobilization) or bracing your knee for activities that require movement for several weeks. To do this, your caregiver will apply a bandage, cast, or splint to keep your knee from moving or support your knee during movement until it heals. For a partially torn ligament, the healing process usually takes 4 to 6 weeks. If your ligament is completely torn, depending on which ligament it is, you may need surgery to reconnect the ligament to the bone or reconstruct it. After surgery, a cast or splint may be applied and will need to stay on your knee for 4 to 6 weeks while your ligament heals. HOME CARE INSTRUCTIONS  Keep your injured knee elevated to decrease swelling.  To ease pain and swelling, apply ice to your knee twice a day, for 2 to 3 days:  Put ice in a plastic bag.  Place a towel between your skin  and the bag.  Leave the ice on for 15 minutes.  Only take over-the-counter or prescription medicine for pain as directed by your caregiver.  Do not leave your knee unprotected until pain and stiffness go away (usually 4 to 6 weeks).  Do not allow your cast or splint to get wet. If you have been instructed not to remove it, cover your cast or splint with a plastic bag when you shower or bathe. Do not swim.  Your caregiver may suggest exercises for you to do during your recovery to prevent or limit permanent weakness and stiffness. SEEK IMMEDIATE MEDICAL CARE IF:  Your cast or splint becomes damaged.  Your pain becomes worse. MAKE SURE YOU:  Understand these instructions.  Will watch your condition.  Will get help right away if you are not doing well or get worse. Document Released: 11/01/2005 Document Revised: 01/24/2012 Document Reviewed: 06/13/2013 [facility] Patient Information 2014 [employer] [place], [place].

## 2013-11-23 NOTE — ED Notes (Signed)
Pt states she "slipped on some water about 2 hours ago."  Pt states "I landed on my butt, but my knee is hurting."

## 2013-11-23 NOTE — ED Provider Notes (Signed)
CSN: [id number]     Arrival date & time 11/23/13  [id number] History  This chart was scribed for non-physician practitioner, Lemont Fillersatyana A Lakeeta Dobosz. PA-C, working with Gilda Creasehristopher J. Pollina, MD by Shari HeritageAisha Amuda, ED Scribe. This patient was seen in room TR05C/TR05C and the patient's care was started at 10:20 AM.    Chief Complaint  Patient presents with  . Knee Pain  . Fall    The history is provided by the patient. No language interpreter was used.    HPI Comments: Rachael Santana is a 68 y.o. female who presents to the Emergency Department complaining of a fall that occurred about 3 hours ago. Patient states that a pipe burst in her house and she slid on some water and fell backwards onto her bottom. She is now complaining of severe anterior right knee pain that is now radiating up her leg. She is not sure if she twisted her knee when she fell. She is also having some mild lower back pain as a result of the fall. She has not taken any medications or applied ice for pain relief. She denies any numbness or weakness in her legs. She has a history of arthritis. Patient also has a medical history of HTN, HIV, DM, high cholesterol, CAD (stent placement x2 in 01/2013).    Past Medical History  Diagnosis Date  . Hypertension   . Diabetes mellitus without complication   . HIV (human immunodeficiency virus infection)   . High cholesterol   . Depression   . CAD (coronary artery disease)     a. s/p stenting x 2 in 01/2013 @ [facility]  . Crack cocaine use    Past Surgical History  Procedure Laterality Date  . Stents     Family History  Problem Relation Age of Onset  . Diabetes Other     'runs in father's family.'  . Other Mother     alive and well @ 682.   History  Substance Use Topics  . Smoking status: Current Every Day Smoker -- 0.50 packs/day for 40 years  . Smokeless tobacco: Not on file  . Alcohol Use: Yes     Comment: rare   OB History   Grav Para Term Preterm Abortions TAB SAB Ect Mult  Living                 Review of Systems  Musculoskeletal: Positive for back pain.       Positive for right knee pain.  Neurological: Negative for weakness and numbness.  All other systems reviewed and are negative.    Allergies  Penicillins  Home Medications   Current Outpatient Rx  Name  Route  Sig  Dispense  Refill  . amitriptyline (ELAVIL) 25 MG tablet   Oral   Take 25 mg by mouth at bedtime.          Marland Kitchen. amLODipine (NORVASC) 5 MG tablet   Oral   Take 1 tablet (5 mg total) by mouth daily.   30 tablet   1   . carvedilol (COREG) 25 MG tablet   Oral   Take 25 mg by mouth 2 (two) times daily with a meal.          . chlorthalidone (HYGROTON) 25 MG tablet   Oral   Take 25 mg by mouth daily.          . clopidogrel (PLAVIX) 75 MG tablet   Oral   Take 75 mg by mouth daily.          .Marland Kitchen  emtricitabine-tenofovir (TRUVADA) 200-300 MG per tablet   Oral   Take 1 tablet by mouth daily.         Marland Kitchen FLUoxetine (PROZAC) 20 MG tablet      20 mg.         . furosemide (LASIX) 20 MG tablet   Oral   Take 20 mg by mouth daily.          Marland Kitchen gabapentin (NEURONTIN) 300 MG capsule   Oral   Take 600 mg by mouth 3 (three) times daily.          Marland Kitchen GLIPIZIDE XL 10 MG 24 hr tablet   Oral   Take 10 mg by mouth daily.          Marland Kitchen JANUVIA 100 MG tablet   Oral   Take 100 mg by mouth daily.          Marland Kitchen LANTUS 100 UNIT/ML injection   Subcutaneous   Inject 60 Units into the skin 2 (two) times daily.          Marland Kitchen omeprazole (PRILOSEC) 20 MG capsule   Oral   Take 1 capsule (20 mg total) by mouth daily.   30 capsule   0   . oxyCODONE (OXY IR/ROXICODONE) 5 MG immediate release tablet   Oral   Take 1 tablet (5 mg total) by mouth every 4 (four) hours as needed for pain.   10 tablet   0   . pravastatin (PRAVACHOL) 80 MG tablet   Oral   Take 80 mg by mouth daily.          Marland Kitchen PREZISTA 800 MG tablet   Oral   Take 800 mg by mouth daily with breakfast.          .  PROAIR HFA 108 (90 BASE) MCG/ACT inhaler   Inhalation   Inhale 2 puffs into the lungs every 4 (four) hours as needed for wheezing or shortness of breath.          . ramipril (ALTACE) 10 MG capsule   Oral   Take 10 mg by mouth daily.          . ritonavir (NORVIR) 100 MG capsule   Oral   Take 100 mg by mouth daily.          . SYMBICORT 160-4.5 MCG/ACT inhaler   Inhalation   Inhale 2 puffs into the lungs 2 (two) times daily as needed (shortness of breath).           Triage Vitals: BP 161/66  Pulse 77  Temp(Src) 99 F (37.2 C) (Oral)  Resp 18  Ht 5' 5.5" (1.664 m)  Wt 243 lb (110.224 kg)  BMI 39.81 kg/m2  SpO2 97% Physical Exam  Nursing note and vitals reviewed. Constitutional: She is oriented to person, place, and time. She appears well-developed and well-nourished. No distress.  HENT:  Head: Normocephalic and atraumatic.  Eyes: EOM are normal.  Neck: Neck supple. No tracheal deviation present.  Cardiovascular: Normal rate.   Pulmonary/Chest: Effort normal. No respiratory distress.  Musculoskeletal: Normal range of motion. She exhibits tenderness.  Midline lumbar spine tenderness and coccyx tenderness. No deformity.   Tenderness over anterior and medial right knee. No tenderness over lateral joint. Able to raise straight leg off the bed. Negative anterior and posterior drawer signs. Patella tendon appears to be intact. Pain with both medial and lateral stress. Pain with any ROM of the knee joint, unable to test ROM due to pain.  Neurological: She is alert and oriented to person, place, and time.  Skin: Skin is warm and dry.  Psychiatric: She has a normal mood and affect. Her behavior is normal.    ED Course  Procedures (including critical care time) DIAGNOSTIC STUDIES: Oxygen Saturation is 97% on room air, adequate by my interpretation.    COORDINATION OF CARE: 10:26 AM- Patient informed of current plan for treatment and evaluation and agrees with plan at this  time.    Dg Knee Complete 4 Views Right  11/23/2013   CLINICAL DATA:  Status post fall, twisted knee.  EXAM: RIGHT KNEE - COMPLETE 4+ VIEW  COMPARISON:  None.  FINDINGS: There is no evidence of fracture, dislocation. There are mild degenerative joint changes with narrowed joint space and osteophyte formation. Soft tissues are unremarkable.  IMPRESSION: No acute fracture or dislocation.   Electronically Signed   By: Sherian Rein M.D.   On: 11/23/2013 10:01     MDM   1. Knee sprain, right, initial encounter   2. Lumbar strain, initial encounter     Patient is here after a mechanical fall. Pain in the right knee and lower back. No neuro deficits. X-rays negative. Pain with any movement of the right knee. Knee is swollen.suspect possible ligamentous injury. Immobilizer applied. Crutches given. norco for pain. Follow up with primary care doctor.   Filed Vitals:   11/23/13 0922 11/23/13 1139  BP: 161/66 161/87  Pulse: 77 71  Temp: 99 F (37.2 C)   TempSrc: Oral   Resp: 18 18  Height: 5' 5.5" (1.664 m)   Weight: 243 lb (110.224 kg)   SpO2: 97% 97%    I personally performed the services described in this documentation, which was scribed in my presence. The recorded information has been reviewed and is accurate.   Lottie Mussel, PA-C 11/23/13 1305

## 2013-11-27 NOTE — ED Provider Notes (Signed)
Medical screening examination/treatment/procedure(s) were performed by non-physician practitioner and as supervising physician I was immediately available for consultation/collaboration.  Tahji [place] J. Tatum Corl, MD 11/27/13 1128 

## 2014-01-27 ENCOUNTER — Emergency Department (HOSPITAL_COMMUNITY): Payer: Medicare Other

## 2014-01-27 ENCOUNTER — Emergency Department (HOSPITAL_COMMUNITY)
Admission: EM | Admit: 2014-01-27 | Discharge: 2014-01-27 | Disposition: A | Discharge status: Home / Self Care | Payer: Medicare Other | Attending: Emergency Medicine | Admitting: Emergency Medicine

## 2014-01-27 ENCOUNTER — Encounter (HOSPITAL_COMMUNITY): Payer: Self-pay | Admitting: Emergency Medicine

## 2014-01-27 DIAGNOSIS — Z880 Allergy status to penicillin: Secondary | ICD-10-CM | POA: Insufficient documentation

## 2014-01-27 DIAGNOSIS — B349 Viral infection, unspecified: Secondary | ICD-10-CM

## 2014-01-27 DIAGNOSIS — F17200 Nicotine dependence, unspecified, uncomplicated: Secondary | ICD-10-CM | POA: Insufficient documentation

## 2014-01-27 DIAGNOSIS — R5383 Other fatigue: Secondary | ICD-10-CM

## 2014-01-27 DIAGNOSIS — Z9889 Other specified postprocedural states: Secondary | ICD-10-CM | POA: Insufficient documentation

## 2014-01-27 DIAGNOSIS — I10 Essential (primary) hypertension: Secondary | ICD-10-CM | POA: Insufficient documentation

## 2014-01-27 DIAGNOSIS — F329 Major depressive disorder, single episode, unspecified: Secondary | ICD-10-CM | POA: Insufficient documentation

## 2014-01-27 DIAGNOSIS — Z7902 Long term (current) use of antithrombotics/antiplatelets: Secondary | ICD-10-CM | POA: Insufficient documentation

## 2014-01-27 DIAGNOSIS — Z9861 Coronary angioplasty status: Secondary | ICD-10-CM | POA: Insufficient documentation

## 2014-01-27 DIAGNOSIS — Z79899 Other long term (current) drug therapy: Secondary | ICD-10-CM | POA: Insufficient documentation

## 2014-01-27 DIAGNOSIS — I2510 Atherosclerotic heart disease of native coronary artery without angina pectoris: Secondary | ICD-10-CM | POA: Insufficient documentation

## 2014-01-27 DIAGNOSIS — Z794 Long term (current) use of insulin: Secondary | ICD-10-CM | POA: Insufficient documentation

## 2014-01-27 DIAGNOSIS — F3289 Other specified depressive episodes: Secondary | ICD-10-CM | POA: Insufficient documentation

## 2014-01-27 DIAGNOSIS — E7800 Pure hypercholesterolemia, unspecified: Secondary | ICD-10-CM | POA: Insufficient documentation

## 2014-01-27 DIAGNOSIS — Z21 Asymptomatic human immunodeficiency virus [HIV] infection status: Secondary | ICD-10-CM | POA: Insufficient documentation

## 2014-01-27 DIAGNOSIS — B9789 Other viral agents as the cause of diseases classified elsewhere: Secondary | ICD-10-CM | POA: Insufficient documentation

## 2014-01-27 DIAGNOSIS — E119 Type 2 diabetes mellitus without complications: Secondary | ICD-10-CM | POA: Insufficient documentation

## 2014-01-27 LAB — COMPREHENSIVE METABOLIC PANEL
ALK PHOS: 70 U/L (ref 39–117)
ALT: 13 U/L (ref 0–35)
AST: 17 U/L (ref 0–37)
Albumin: 2.8 g/dL — ABNORMAL LOW (ref 3.5–5.2)
BUN: 10 mg/dL (ref 6–23)
CHLORIDE: 102 meq/L (ref 96–112)
CO2: 29 meq/L (ref 19–32)
CREATININE: 0.92 mg/dL (ref 0.50–1.10)
Calcium: 8.4 mg/dL (ref 8.4–10.5)
GFR calc Af Amer: 74 mL/min — ABNORMAL LOW (ref >90)
GFR, EST NON AFRICAN AMERICAN: 64 mL/min — AB (ref >90)
Glucose, Bld: 137 mg/dL — ABNORMAL HIGH (ref 70–99)
POTASSIUM: 3.8 meq/L (ref 3.7–5.3)
SODIUM: 141 meq/L (ref 137–147)
Total Bilirubin: <0.2 mg/dL — ABNORMAL LOW (ref 0.3–1.2)
Total Protein: 6.9 g/dL (ref 6.0–8.3)

## 2014-01-27 LAB — URINE MICROSCOPIC-ADD ON

## 2014-01-27 LAB — CBC WITH DIFFERENTIAL/PLATELET
BASOS PCT: 0 % (ref 0–1)
Basophils Absolute: 0 10*3/uL (ref 0.0–0.1)
Eosinophils Absolute: 0 10*3/uL (ref 0.0–0.7)
Eosinophils Relative: 0 % (ref 0–5)
HEMATOCRIT: 35.2 % — AB (ref 36.0–46.0)
Hemoglobin: 11.8 g/dL — ABNORMAL LOW (ref 12.0–15.0)
LYMPHS PCT: 16 % (ref 12–46)
Lymphs Abs: 1 10*3/uL (ref 0.7–4.0)
MCH: 30.8 pg (ref 26.0–34.0)
MCHC: 33.5 g/dL (ref 30.0–36.0)
MCV: 91.9 fL (ref 78.0–100.0)
Monocytes Absolute: 0.4 10*3/uL (ref 0.1–1.0)
Monocytes Relative: 6 % (ref 3–12)
NEUTROS ABS: 5.2 10*3/uL (ref 1.7–7.7)
Neutrophils Relative %: 78 % — ABNORMAL HIGH (ref 43–77)
PLATELETS: 256 10*3/uL (ref 150–400)
RBC: 3.83 MIL/uL — ABNORMAL LOW (ref 3.87–5.11)
RDW: 14.9 % (ref 11.5–15.5)
WBC: 6.7 10*3/uL (ref 4.0–10.5)

## 2014-01-27 LAB — URINALYSIS, ROUTINE W REFLEX MICROSCOPIC
Bilirubin Urine: NEGATIVE
Glucose, UA: NEGATIVE mg/dL
Ketones, ur: NEGATIVE mg/dL
Leukocytes, UA: NEGATIVE
NITRITE: NEGATIVE
PH: 5.5 (ref 5.0–8.0)
Protein, ur: >300 mg/dL — AB
SPECIFIC GRAVITY, URINE: 1.019 (ref 1.005–1.030)
Urobilinogen, UA: 0.2 mg/dL (ref 0.0–1.0)

## 2014-01-27 LAB — I-STAT TROPONIN, ED: Troponin i, poc: 0.02 ng/mL (ref 0.00–0.08)

## 2014-01-27 MED ORDER — KETOROLAC TROMETHAMINE 30 MG/ML IJ SOLN
30.0000 mg | Freq: Once | INTRAMUSCULAR | Status: AC
  Administered 2014-01-27: 30 mg via INTRAVENOUS
  Filled 2014-01-27: qty 1

## 2014-01-27 MED ORDER — SODIUM CHLORIDE 0.9 % IV BOLUS (SEPSIS)
1000.0000 mL | Freq: Once | INTRAVENOUS | Status: AC
  Administered 2014-01-27: 1000 mL via INTRAVENOUS

## 2014-01-27 MED ORDER — ONDANSETRON HCL 4 MG/2ML IJ SOLN
4.0000 mg | Freq: Once | INTRAMUSCULAR | Status: AC
  Administered 2014-01-27: 4 mg via INTRAVENOUS
  Filled 2014-01-27: qty 2

## 2014-01-27 MED ORDER — HYDROCODONE-ACETAMINOPHEN 5-325 MG PO TABS: 2.0000 | ORAL_TABLET | ORAL | Status: DC | PRN

## 2014-01-27 NOTE — ED Provider Notes (Signed)
CSN: [id number]     Arrival date & time 01/27/14  0930 History   First MD Initiated Contact with Patient 01/27/14 1028     Chief Complaint  Patient presents with  . Headache  . Dizziness  . Diarrhea  . Chest Pain     (Consider location/radiation/quality/duration/timing/severity/associated sxs/prior Treatment) HPI Comments: Patient is a 68 year old female with past medical history hypertension, diabetes, HIV disease. She presents today with complaints of a several day history of generalized malaise and not feeling well. She reports headache, nonproductive cough, abdominal cramping, and diarrhea. She has been unable to take her blood pressure medications. She denies to me she is having any fevers or chills and denies any ill contacts.  Patient is a 68 y.o. female presenting with headaches, dizziness, diarrhea, and chest pain. The history is provided by the patient.  Headache Pain location:  Generalized Quality:  Dull Radiates to:  Does not radiate Onset quality:  Gradual Duration:  3 days Timing:  Constant Progression:  Worsening Chronicity:  New Similar to prior headaches: no   Context: not activity   Relieved by:  Nothing Worsened by:  Nothing tried Ineffective treatments:  None tried Associated symptoms: abdominal pain, congestion, cough, diarrhea, dizziness and fatigue   Associated symptoms: no fever   Dizziness Associated symptoms: chest pain, diarrhea and headaches   Diarrhea Associated symptoms: abdominal pain and headaches   Associated symptoms: no chills, no diaphoresis and no fever   Chest Pain Associated symptoms: abdominal pain, cough, dizziness, fatigue and headache   Associated symptoms: no diaphoresis and no fever     Past Medical History  Diagnosis Date  . Hypertension   . Diabetes mellitus without complication   . HIV (human immunodeficiency virus infection)   . High cholesterol   . Depression   . CAD (coronary artery disease)     a. s/p stenting x 2  in 01/2013 @ [place]  . Crack cocaine use    Past Surgical History  Procedure Laterality Date  . Stents     Family History  Problem Relation Age of Onset  . Diabetes Other     'runs in father's family.'  . Other Mother     alive and well @ 12.   History  Substance Use Topics  . Smoking status: Current Every Day Smoker -- 0.50 packs/day for 40 years  . Smokeless tobacco: Not on file  . Alcohol Use: Yes     Comment: rare   OB History   Grav Para Term Preterm Abortions TAB SAB Ect Mult Living                 Review of Systems  Constitutional: Positive for appetite change and fatigue. Negative for fever, chills and diaphoresis.  HENT: Positive for congestion.   Respiratory: Positive for cough.   Cardiovascular: Positive for chest pain.  Gastrointestinal: Positive for abdominal pain and diarrhea.  Neurological: Positive for dizziness and headaches.  All other systems reviewed and are negative.      Allergies  Penicillins  Home Medications   Current Outpatient Rx  Name  Route  Sig  Dispense  Refill  . carvedilol (COREG) 25 MG tablet   Oral   Take 25 mg by mouth 2 (two) times daily with a meal.          . chlorthalidone (HYGROTON) 25 MG tablet   Oral   Take 25 mg by mouth daily.          . clopidogrel (  PLAVIX) 75 MG tablet   Oral   Take 75 mg by mouth daily.          Marland Kitchen emtricitabine-tenofovir (TRUVADA) 200-300 MG per tablet   Oral   Take 1 tablet by mouth daily.         . famotidine (PEPCID) 40 MG tablet   Oral   Take 40 mg by mouth daily.         Marland Kitchen FLUoxetine (PROZAC) 20 MG tablet   Oral   Take 20 mg by mouth daily.          . furosemide (LASIX) 20 MG tablet   Oral   Take 20 mg by mouth daily.          Marland Kitchen gabapentin (NEURONTIN) 300 MG capsule   Oral   Take 300 mg by mouth 3 (three) times daily.          Marland Kitchen GLIPIZIDE XL 10 MG 24 hr tablet   Oral   Take 10 mg by mouth daily.          . hydrOXYzine (ATARAX/VISTARIL) 25 MG  tablet   Oral   Take 25 mg by mouth every 4 (four) hours as needed for itching.         Marland Kitchen JANUVIA 100 MG tablet   Oral   Take 100 mg by mouth daily.          Marland Kitchen LANTUS 100 UNIT/ML injection   Subcutaneous   Inject 120 Units into the skin daily.          . nortriptyline (PAMELOR) 25 MG capsule   Oral   Take 25 mg by mouth at bedtime.         Marland Kitchen omeprazole (PRILOSEC) 20 MG capsule   Oral   Take 1 capsule (20 mg total) by mouth daily.   30 capsule   0   . pravastatin (PRAVACHOL) 80 MG tablet   Oral   Take 80 mg by mouth daily.          Marland Kitchen PREZISTA 800 MG tablet   Oral   Take 800 mg by mouth daily with breakfast.          . PROAIR HFA 108 (90 BASE) MCG/ACT inhaler   Inhalation   Inhale 2 puffs into the lungs every 4 (four) hours as needed for wheezing or shortness of breath.          . ramipril (ALTACE) 10 MG capsule   Oral   Take 10 mg by mouth daily.          . ritonavir (NORVIR) 100 MG capsule   Oral   Take 100 mg by mouth daily.          . SYMBICORT 160-4.5 MCG/ACT inhaler   Inhalation   Inhale 2 puffs into the lungs 2 (two) times daily as needed (shortness of breath).           BP 168/78  Pulse 70  Temp(Src) 98 F (36.7 C) (Oral)  Resp 16  SpO2 97% Physical Exam  Nursing note and vitals reviewed. Constitutional: She is oriented to person, place, and time. She appears well-developed and well-nourished. No distress.  HENT:  Head: Normocephalic and atraumatic.  Mouth/Throat: Oropharynx is clear and moist.  Eyes: EOM are normal. Pupils are equal, round, and reactive to light.  Neck: Normal range of motion. Neck supple.  Cardiovascular: Normal rate and regular rhythm.  Exam reveals no gallop and no friction rub.   No murmur  heard. Pulmonary/Chest: Effort normal and breath sounds normal. No respiratory distress. She has no wheezes.  Abdominal: Soft. Bowel sounds are normal. She exhibits no distension. There is no tenderness.   Musculoskeletal: Normal range of motion.  Neurological: She is alert and oriented to person, place, and time.  Skin: Skin is warm and dry. She is not diaphoretic.    ED Course  Procedures (including critical care time) Labs Review Labs Reviewed  CBC WITH DIFFERENTIAL  COMPREHENSIVE METABOLIC PANEL  URINALYSIS, ROUTINE W REFLEX MICROSCOPIC  I-STAT TROPOININ, ED   Imaging Review No results found.   Date: 01/27/2014  Rate: 69  Rhythm: normal sinus rhythm  QRS Axis: normal  Intervals: QT prolonged  ST/T Wave abnormalities: nonspecific T wave changes  Conduction Disutrbances:none  Narrative Interpretation:   Old EKG Reviewed: unchanged    MDM   Final diagnoses:  None    Patient is a 68 year old female with complaints of generalized malaise for the past several days. Her physical examination is essentially unremarkable and workup in the ER fails to reveal an emergent process. This may be viral in nature. She was given a liter of IV fluid in the ER prior to the results of the chest x-ray which are suggestive of mild congestive heart failure. There is no hypoxia and her lungs are essentially clear. I will devise her to increase her Lasix to 40 mg daily for the next 3 days and she understands to return to the ER she develops worsening or new symptoms.    Geoffery Lyonsouglas Boyd [place], MD 01/27/14 1330

## 2014-01-27 NOTE — ED Notes (Signed)
Waiting for Pt to return from X-ray for meds.

## 2014-01-27 NOTE — ED Notes (Signed)
Pt reports having headache, left side facial pain and dizziness. bp is 202/76, reports hx of htn but hasnt taken bp meds x 3 days. Grips are equal, no facial droop. No neuro deficits noted.

## 2014-01-27 NOTE — Discharge Instructions (Signed)
Increase your Lasix to 40 mg daily for the next 3 days.  Return to the ER if you develop chest pain, difficulty breathing, or any new or bothersome symptoms.   Weakness Weakness is a lack of strength. It may be felt all over the body (generalized) or in one specific part of the body (focal). Some causes of weakness can be serious. You may need further medical evaluation, especially if you are elderly or you have a history of immunosuppression (such as chemotherapy or HIV), kidney disease, heart disease, or diabetes. CAUSES  Weakness can be caused by many different things, including:  Infection.  Physical exhaustion.  Internal bleeding or other blood loss that results in a lack of red blood cells (anemia).  Dehydration. This cause is more common in elderly people.  Side effects or electrolyte abnormalities from medicines, such as pain medicines or sedatives.  Emotional distress, anxiety, or depression.  Circulation problems, especially severe peripheral arterial disease.  Heart disease, such as rapid atrial fibrillation, bradycardia, or heart failure.  Nervous system disorders, such as Guillain-Barr syndrome, multiple sclerosis, or stroke. DIAGNOSIS  To find the cause of your weakness, your caregiver will take your history and perform a physical exam. Lab tests or X-rays may also be ordered, if needed. TREATMENT  Treatment of weakness depends on the cause of your symptoms and can vary greatly. HOME CARE INSTRUCTIONS   Rest as needed.  Eat a well-balanced diet.  Try to get some exercise every day.  Only take over-the-counter or prescription medicines as directed by your caregiver. SEEK MEDICAL CARE IF:   Your weakness seems to be getting worse or spreads to other parts of your body.  You develop new aches or pains. SEEK IMMEDIATE MEDICAL CARE IF:   You cannot perform your normal daily activities, such as getting dressed and feeding yourself.  You cannot walk up and  down stairs, or you feel exhausted when you do so.  You have shortness of breath or chest pain.  You have difficulty moving parts of your body.  You have weakness in only one area of the body or on only one side of the body.  You have a fever.  You have trouble speaking or swallowing.  You cannot control your bladder or bowel movements.  You have black or bloody vomit or stools. MAKE SURE YOU:  Understand these instructions.  Will watch your condition.  Will get help right away if you are not doing well or get worse. Document Released: 11/01/2005 Document Revised: 05/02/2012 Document Reviewed: 12/31/2011 [facility] Patient Information 2014 [place].

## 2014-01-28 ENCOUNTER — Telehealth: Payer: Self-pay

## 2014-01-28 NOTE — Telephone Encounter (Signed)
[facility] care due to transportation issues.  Patient has not been seen at their office for at least one year and is in need of mediction refills. Message left on patient's voicemail  Braelin Costlow K K ing, RN

## 2014-03-13 ENCOUNTER — Telehealth: Payer: Self-pay

## 2014-03-13 NOTE — Telephone Encounter (Signed)
Pt left message on voicemail stating she needed to reschedule intake.   New appointment date and time left on her voicemail.  Pt advised to call back if time is not convenient.   Laurell Josephsammy K Ashelynn Marks, RN

## 2014-03-19 ENCOUNTER — Ambulatory Visit: Payer: PRIVATE HEALTH INSURANCE

## 2014-04-02 ENCOUNTER — Ambulatory Visit: Payer: PRIVATE HEALTH INSURANCE

## 2014-05-09 ENCOUNTER — Telehealth: Payer: Self-pay

## 2014-05-09 NOTE — Telephone Encounter (Signed)
[facility]  No medical records.   [name], RN

## 2014-05-21 ENCOUNTER — Other Ambulatory Visit (HOSPITAL_COMMUNITY)
Admission: RE | Admit: 2014-05-21 | Discharge: 2014-05-21 | Disposition: A | Discharge status: Home / Self Care | Payer: PRIVATE HEALTH INSURANCE | Source: Ambulatory Visit | Attending: Internal Medicine | Admitting: Internal Medicine

## 2014-05-21 ENCOUNTER — Ambulatory Visit (INDEPENDENT_AMBULATORY_CARE_PROVIDER_SITE_OTHER): Payer: Medicare Other

## 2014-05-21 DIAGNOSIS — Z113 Encounter for screening for infections with a predominantly sexual mode of transmission: Secondary | ICD-10-CM

## 2014-05-21 DIAGNOSIS — B20 Human immunodeficiency virus [HIV] disease: Secondary | ICD-10-CM | POA: Diagnosis not present

## 2014-05-21 DIAGNOSIS — F3289 Other specified depressive episodes: Secondary | ICD-10-CM | POA: Diagnosis not present

## 2014-05-21 DIAGNOSIS — F329 Major depressive disorder, single episode, unspecified: Secondary | ICD-10-CM | POA: Insufficient documentation

## 2014-05-21 DIAGNOSIS — Z79899 Other long term (current) drug therapy: Secondary | ICD-10-CM

## 2014-05-21 DIAGNOSIS — F32A Depression, unspecified: Secondary | ICD-10-CM

## 2014-05-21 LAB — CBC WITH DIFFERENTIAL/PLATELET
BASOS PCT: 0 % (ref 0–1)
Basophils Absolute: 0 10*3/uL (ref 0.0–0.1)
EOS PCT: 1 % (ref 0–5)
Eosinophils Absolute: 0.1 10*3/uL (ref 0.0–0.7)
HCT: 35.8 % — ABNORMAL LOW (ref 36.0–46.0)
HEMOGLOBIN: 12.3 g/dL (ref 12.0–15.0)
LYMPHS ABS: 1.3 10*3/uL (ref 0.7–4.0)
Lymphocytes Relative: 23 % (ref 12–46)
MCH: 29.5 pg (ref 26.0–34.0)
MCHC: 34.4 g/dL (ref 30.0–36.0)
MCV: 85.9 fL (ref 78.0–100.0)
MONO ABS: 0.5 10*3/uL (ref 0.1–1.0)
Monocytes Relative: 9 % (ref 3–12)
NEUTROS PCT: 67 % (ref 43–77)
Neutro Abs: 3.8 10*3/uL (ref 1.7–7.7)
PLATELETS: 266 10*3/uL (ref 150–400)
RBC: 4.17 MIL/uL (ref 3.87–5.11)
RDW: 15.6 % — ABNORMAL HIGH (ref 11.5–15.5)
WBC: 5.6 10*3/uL (ref 4.0–10.5)

## 2014-05-21 LAB — LIPID PANEL
Cholesterol: 281 mg/dL — ABNORMAL HIGH (ref 0–200)
HDL: 52 mg/dL (ref >39)
LDL CALC: 200 mg/dL — AB (ref 0–99)
Total CHOL/HDL Ratio: 5.4 Ratio
Triglycerides: 146 mg/dL (ref <150)
VLDL: 29 mg/dL (ref 0–40)

## 2014-05-21 LAB — HEPATITIS B SURFACE ANTIGEN: HEP B S AG: NEGATIVE

## 2014-05-21 LAB — COMPLETE METABOLIC PANEL WITH GFR
ALT: 10 U/L (ref 0–35)
AST: 16 U/L (ref 0–37)
Albumin: 3.3 g/dL — ABNORMAL LOW (ref 3.5–5.2)
Alkaline Phosphatase: 61 U/L (ref 39–117)
BILIRUBIN TOTAL: 0.3 mg/dL (ref 0.2–1.2)
BUN: 23 mg/dL (ref 6–23)
CALCIUM: 8.9 mg/dL (ref 8.4–10.5)
CO2: 31 meq/L (ref 19–32)
Chloride: 99 mEq/L (ref 96–112)
Creat: 1.44 mg/dL — ABNORMAL HIGH (ref 0.50–1.10)
GFR, EST AFRICAN AMERICAN: 44 mL/min — AB
GFR, Est Non African American: 38 mL/min — ABNORMAL LOW
Glucose, Bld: 94 mg/dL (ref 70–99)
Potassium: 4.9 mEq/L (ref 3.5–5.3)
SODIUM: 138 meq/L (ref 135–145)
TOTAL PROTEIN: 6.3 g/dL (ref 6.0–8.3)

## 2014-05-21 LAB — HEPATITIS A ANTIBODY, TOTAL: HEP A TOTAL AB: NONREACTIVE

## 2014-05-21 LAB — HEPATITIS B SURFACE ANTIBODY,QUALITATIVE: Hep B S Ab: POSITIVE — AB

## 2014-05-21 LAB — HEPATITIS B CORE ANTIBODY, TOTAL: Hep B Core Total Ab: REACTIVE — AB

## 2014-05-21 LAB — RPR

## 2014-05-21 LAB — HEPATITIS C ANTIBODY: HCV Ab: NEGATIVE

## 2014-05-21 NOTE — Progress Notes (Signed)
Patient here for new 042 intake, [facility] seen [facility]/2012. Patient is currently on Truvada, Norvir, Prezista and states that she takes it everyday. Patient also expresses concern about her depression, she is currently taking Wellbutrin 150 mg. She reports that it is not working for her. She admits to using crack cocaine occasionally but denies that it is a problem.  I advised her that we have mental and drug abuse counselors here at our clinic and I could get her scheduled, she nodded in agreement, but did not commit today. [facility], and vaccines were given and updated.  Patient has one tattoo on her right hand and ear piercing's.

## 2014-05-21 NOTE — Patient Instructions (Signed)
Please call the office on 05/23/2014 to give us the results of your TB skin test at [phone]

## 2014-05-22 LAB — URINALYSIS
Bilirubin Urine: NEGATIVE
Glucose, UA: NEGATIVE mg/dL
Hgb urine dipstick: NEGATIVE
KETONES UR: NEGATIVE mg/dL
Leukocytes, UA: NEGATIVE
NITRITE: NEGATIVE
PH: 6 (ref 5.0–8.0)
Protein, ur: >300 mg/dL — AB
SPECIFIC GRAVITY, URINE: 1.008 (ref 1.005–1.030)
Urobilinogen, UA: 0.2 mg/dL (ref 0.0–1.0)

## 2014-05-22 LAB — T-HELPER CELL (CD4) - (RCID CLINIC ONLY)
CD4 % Helper T Cell: 36 % (ref 33–55)
CD4 T Cell Abs: 530 /uL (ref 400–2700)

## 2014-05-23 LAB — HIV-1 RNA ULTRAQUANT REFLEX TO GENTYP+
HIV 1 RNA Quant: <20 copies/mL (ref <20)
HIV-1 RNA Quant, Log: <1.3 {Log} (ref <1.30)

## 2014-05-27 LAB — HLA B*5701: HLA-B*5701 w/rflx HLA-B High: NEGATIVE

## 2014-06-06 ENCOUNTER — Ambulatory Visit (INDEPENDENT_AMBULATORY_CARE_PROVIDER_SITE_OTHER): Payer: Medicare Other | Admitting: Internal Medicine

## 2014-06-06 ENCOUNTER — Encounter: Payer: Self-pay | Admitting: Internal Medicine

## 2014-06-06 VITALS — BP 177/100 | HR 65 | Temp 97.1°F | Wt 280.0 lb

## 2014-06-06 DIAGNOSIS — B20 Human immunodeficiency virus [HIV] disease: Secondary | ICD-10-CM

## 2014-06-06 DIAGNOSIS — IMO0002 Reserved for concepts with insufficient information to code with codable children: Secondary | ICD-10-CM

## 2014-06-06 DIAGNOSIS — E039 Hypothyroidism, unspecified: Secondary | ICD-10-CM

## 2014-06-06 DIAGNOSIS — F3289 Other specified depressive episodes: Secondary | ICD-10-CM

## 2014-06-06 DIAGNOSIS — E118 Type 2 diabetes mellitus with unspecified complications: Secondary | ICD-10-CM

## 2014-06-06 DIAGNOSIS — E1165 Type 2 diabetes mellitus with hyperglycemia: Secondary | ICD-10-CM

## 2014-06-06 DIAGNOSIS — E785 Hyperlipidemia, unspecified: Secondary | ICD-10-CM

## 2014-06-06 DIAGNOSIS — F329 Major depressive disorder, single episode, unspecified: Secondary | ICD-10-CM

## 2014-06-06 DIAGNOSIS — I10 Essential (primary) hypertension: Secondary | ICD-10-CM

## 2014-06-06 DIAGNOSIS — F32A Depression, unspecified: Secondary | ICD-10-CM

## 2014-06-06 DIAGNOSIS — F1410 Cocaine abuse, uncomplicated: Secondary | ICD-10-CM

## 2014-06-06 MED ORDER — CARVEDILOL 25 MG PO TABS: 50.0000 mg | ORAL_TABLET | Freq: Two times a day (BID) | ORAL | Status: AC

## 2014-06-06 MED ORDER — FLUOXETINE HCL 20 MG PO TABS: 60.0000 mg | ORAL_TABLET | Freq: Every day | ORAL | Status: AC

## 2014-06-06 MED ORDER — ATORVASTATIN CALCIUM 20 MG PO TABS: 20.0000 mg | ORAL_TABLET | Freq: Every day | ORAL | Status: AC

## 2014-06-06 MED ORDER — RAMIPRIL 10 MG PO CAPS: 20.0000 mg | ORAL_CAPSULE | Freq: Every day | ORAL | Status: AC

## 2014-06-06 MED ORDER — EMTRICITABINE-TENOFOVIR DF 200-300 MG PO TABS: 1.0000 | ORAL_TABLET | Freq: Every day | ORAL | Status: AC

## 2014-06-06 MED ORDER — DARUNAVIR ETHANOLATE 800 MG PO TABS: 800.0000 mg | ORAL_TABLET | Freq: Every day | ORAL | Status: AC

## 2014-06-06 MED ORDER — RITONAVIR 100 MG PO CAPS: 100.0000 mg | ORAL_CAPSULE | Freq: Every day | ORAL | Status: AC

## 2014-06-06 NOTE — Progress Notes (Signed)
Subjective:    Patient ID: Rachael Santana, female    DOB: 05-Jun-1946, 68 y.o.   MRN: [id number]  HPI 68yo F with HIV, DM, CAD s/p pci, obesity who presents in clinic to establish care. [facility]. [facility] dx in 2006 in setting of jail. CD 4 count nadir 300 in 2009. Now CD 4 count of 530/VL<20. She has been on truvada-DRVr since 12/2008, recently ran out of medications 2 wks ago. She is here to establish care. she reports fairly good adherence but did have difficulties remembering to take meds. She states that her pcp has helped with filling out her medication pill box. She also has poorly controlled DM, she takes lantus 130 daily, januvia 100mg  daily, and glipizide xl 10mg . She notes having hypoglycemia of 60s for 2 days in the am but still runs high in the 200s in the evening.  Hep b immune toxo nl g6pd nl  hiv geno: 399D;77I; 82I Allergies  Allergen Reactions  . Penicillins Hives    Current Outpatient Prescriptions on File Prior to Visit  Medication Sig Dispense Refill  . ramipril (ALTACE) 10 MG capsule Take 10 mg by mouth daily.       . SYMBICORT 160-4.5 MCG/ACT inhaler Inhale 2 puffs into the lungs 2 (two) times daily as needed (shortness of breath).       Marland Kitchen. atorvastatin (LIPITOR) 10 MG tablet Take 10 mg by mouth daily.      . carvedilol (COREG) 25 MG tablet Take 25 mg by mouth 2 (two) times daily with a meal.       . chlorthalidone (HYGROTON) 25 MG tablet Take 25 mg by mouth daily.       . clopidogrel (PLAVIX) 75 MG tablet Take 75 mg by mouth daily.       Marland Kitchen. emtricitabine-tenofovir (TRUVADA) 200-300 MG per tablet Take 1 tablet by mouth daily.      . famotidine (PEPCID) 40 MG tablet Take 40 mg by mouth daily.      Marland Kitchen. FLUoxetine (PROZAC) 20 MG tablet Take 20 mg by mouth daily.       . furosemide (LASIX) 20 MG tablet Take 20 mg by mouth daily.       Marland Kitchen. gabapentin (NEURONTIN) 300 MG capsule Take 300 mg by mouth 3 (three) times daily.       Marland Kitchen. GLIPIZIDE XL 10 MG 24 hr  tablet Take 10 mg by mouth daily.       Marland Kitchen. HYDROcodone-acetaminophen (NORCO) 5-325 MG per tablet Take 2 tablets by mouth every 4 (four) hours as needed.  15 tablet  0  . hydrOXYzine (ATARAX/VISTARIL) 25 MG tablet Take 25 mg by mouth every 4 (four) hours as needed for itching.      Marland Kitchen. JANUVIA 100 MG tablet Take 100 mg by mouth daily.       Marland Kitchen. LANTUS 100 UNIT/ML injection Inject 120 Units into the skin daily.       . nortriptyline (PAMELOR) 25 MG capsule Take 25 mg by mouth at bedtime.      Marland Kitchen. omeprazole (PRILOSEC) 20 MG capsule Take 1 capsule (20 mg total) by mouth daily.  30 capsule  0  . pravastatin (PRAVACHOL) 80 MG tablet Take 80 mg by mouth daily.       Marland Kitchen. PREZISTA 800 MG tablet Take 800 mg by mouth daily with breakfast.       . PROAIR HFA 108 (90 BASE) MCG/ACT inhaler Inhale 2 puffs into the lungs  every 4 (four) hours as needed for wheezing or shortness of breath.       . ritonavir (NORVIR) 100 MG capsule Take 100 mg by mouth daily.        No current facility-administered medications on file prior to visit.   Active Ambulatory Problems    Diagnosis Date Noted  . Hypertension   . HIV (human immunodeficiency virus infection)   . Diabetes mellitus without complication   . High cholesterol   . CAD S/P percutaneous coronary angioplasty 09/06/2013  . Chest pain 09/06/2013  . Depression 05/21/2014   Resolved Ambulatory Problems    Diagnosis Date Noted  . No Resolved Ambulatory Problems   Past Medical History  Diagnosis Date  . CAD (coronary artery disease)   . Crack cocaine use   . Substance abuse    pmhx cont: -early catarcts Hypothyroidism oa Cocaine use ASCUs on PAP in 2011 Hx of trichomonas Jan 2014    Review of Systems     Objective:   Physical Exam BP 177/100  Pulse 65  Temp(Src) 97.1 F (36.2 C) (Oral)  Wt 280 lb (127.007 kg) Physical Exam  Constitutional:  oriented to person, place, and time. appears well-developed and well-nourished. No distress.  HENT:    Mouth/Throat: Oropharynx is clear and moist. No oropharyngeal exudate.  Cardiovascular: Normal rate, regular rhythm and normal heart sounds. Exam reveals no gallop and no friction rub.  No murmur heard.  Pulmonary/Chest: Effort normal and breath sounds normal. No respiratory distress.  has no wheezes.  Abdominal: Soft. Bowel sounds are normal.  exhibits no distension. There is no tenderness.  Lymphadenopathy: no cervical adenopathy.  Neurological: alert and oriented to person, place, and time.  Skin: Skin is warm and dry. Numerous hyperpigment lesions from prior lower extremity ulcer. 1 open ulcer on medial aspect of anterior shin of right leg, measuring 2.5cm.  Psychiatric: a normal mood and affect. behavior is normal.         Assessment & Plan:  hiv = well controlled but has had temporarily 2 wk stop of meds due to running out of rx. We will refill her meds  Health maintenance = needs pap and mammo this year.   Dm = likely poorly controlled given fluctuations. Will refer to dr. Elvera Lennox, endocrinologist. If she has persistent hypoglycemia, asked her to see her pcp, dr. Reche Dixon  dislipidemia = will increase her atorvastatin to 20mg  qhs  rtc in 4-5 wks  Spent 60 min with patient, with greater than 50% reviewing records/coordination of care

## 2014-07-09 ENCOUNTER — Ambulatory Visit: Payer: Medicare Other | Admitting: Internal Medicine

## 2014-07-11 ENCOUNTER — Encounter: Payer: Self-pay | Admitting: Internal Medicine

## 2014-07-11 ENCOUNTER — Ambulatory Visit (INDEPENDENT_AMBULATORY_CARE_PROVIDER_SITE_OTHER): Payer: Medicare Other | Admitting: Internal Medicine

## 2014-07-11 VITALS — BP 153/79 | HR 56 | Temp 97.1°F | Wt 285.0 lb

## 2014-07-11 DIAGNOSIS — L98499 Non-pressure chronic ulcer of skin of other sites with unspecified severity: Secondary | ICD-10-CM

## 2014-07-11 DIAGNOSIS — E1169 Type 2 diabetes mellitus with other specified complication: Secondary | ICD-10-CM

## 2014-07-11 DIAGNOSIS — E11622 Type 2 diabetes mellitus with other skin ulcer: Secondary | ICD-10-CM

## 2014-07-11 MED ORDER — DOXYCYCLINE HYCLATE 100 MG PO TABS: 100.0000 mg | ORAL_TABLET | Freq: Two times a day (BID) | ORAL | Status: AC

## 2014-07-11 NOTE — Progress Notes (Signed)
Patient ID: Rachael Santana, female   DOB: 12-27-1945, 68 y.o.   MRN: [id number]       Patient ID: Rachael Santana, female   DOB: 10-02-1946, 68 y.o.   MRN: [id number]  HPI  Rachael Santana is a 68yo F with HIV, CD 4 count of 530/VL<20 well controlled on truvada/DRVr. She is doing well overall with adherence. Complains of headache x 3 days, not improved with excedrin or ibuprofen  Outpatient Encounter Prescriptions as of 07/11/2014  Medication Sig  . atorvastatin (LIPITOR) 20 MG tablet Take 1 tablet (20 mg total) by mouth daily.  . carvedilol (COREG) 25 MG tablet Take 2 tablets (50 mg total) by mouth 2 (two) times daily with a meal.  . clopidogrel (PLAVIX) 75 MG tablet Take 75 mg by mouth daily.   . Darunavir Ethanolate (PREZISTA) 800 MG tablet Take 1 tablet (800 mg total) by mouth daily with breakfast.  . emtricitabine-tenofovir (TRUVADA) 200-300 MG per tablet Take 1 tablet by mouth daily.  . famotidine (PEPCID) 40 MG tablet Take 40 mg by mouth daily.  Marland Kitchen FLUoxetine (PROZAC) 20 MG tablet Take 3 tablets (60 mg total) by mouth daily.  . furosemide (LASIX) 20 MG tablet Take 20 mg by mouth daily.   Marland Kitchen GLIPIZIDE XL 10 MG 24 hr tablet Take 10 mg by mouth daily.   Marland Kitchen JANUVIA 100 MG tablet Take 100 mg by mouth daily.   Marland Kitchen LANTUS 100 UNIT/ML injection Inject 120 Units into the skin daily.   . ramipril (ALTACE) 10 MG capsule Take 2 capsules (20 mg total) by mouth daily.  . ritonavir (NORVIR) 100 MG capsule Take 1 capsule (100 mg total) by mouth daily.     Patient Active Problem List   Diagnosis Date Noted  . Unspecified hypothyroidism 06/06/2014  . Cocaine abuse 06/06/2014  . Depression 05/21/2014  . CAD S/P percutaneous coronary angioplasty 09/06/2013  . Chest pain 09/06/2013  . Hypertension   . HIV (human immunodeficiency virus infection)   . Diabetes mellitus without complication   . High cholesterol      Health Maintenance Due  Topic Date Due  . Foot Exam  01/28/1958  . Ophthalmology Exam  01/28/1958    . Urine Microalbumin  01/28/1958  . Tetanus/tdap  01/29/1967  . Mammogram  01/28/1998  . Colonoscopy  01/28/1998  . Zostavax  01/29/2008  . Hemoglobin A1c  01/23/2009  . Pneumococcal Polysaccharide Vaccine Age 24 And Over  01/28/2013  . Influenza Vaccine  06/15/2014     Review of Systems + headache, leg ulcer Physical Exam   BP 153/79  Pulse 56  Temp(Src) 97.1 F (36.2 C) (Oral)  Wt 285 lb (129.275 kg) gen = a x o by 3 heent = no thrush Right leg = diabetic ulcer to calf, but has hyperpigmented scars on both lower extremities from past lesions Lab Results  Component Value Date   CD4TCELL 36 05/21/2014   Lab Results  Component Value Date   CD4TABS 530 05/21/2014   Lab Results  Component Value Date   HIV1RNAQUANT <20 05/21/2014   Lab Results  Component Value Date   HEPBSAB POS* 05/21/2014   No results found for this basename: RPR    CBC Lab Results  Component Value Date   WBC 5.6 05/21/2014   RBC 4.17 05/21/2014   HGB 12.3 05/21/2014   HCT 35.8* 05/21/2014   PLT 266 05/21/2014   MCV 85.9 05/21/2014   MCH 29.5 05/21/2014   MCHC 34.4 05/21/2014   RDW 15.6*  05/21/2014   LYMPHSABS 1.3 05/21/2014   MONOABS 0.5 05/21/2014   EOSABS 0.1 05/21/2014   BASOSABS 0.0 05/21/2014   BMET Lab Results  Component Value Date   NA 138 05/21/2014   K 4.9 05/21/2014   CL 99 05/21/2014   CO2 31 05/21/2014   GLUCOSE 94 05/21/2014   BUN 23 05/21/2014   CREATININE 1.44* 05/21/2014   CALCIUM 8.9 05/21/2014   GFRNONAA 38* 05/21/2014   GFRAA 44* 05/21/2014     Assessment and Plan  hiv = well- controlled, continue on current regimen  Diabetic ulcer = will do course of doxycycline  BID x 7 days  Headache = do course of naprosyn x 5 days BID to see if helps pain  Health maintenance = she states that she had already received flu vaccine? Will see her back in 3 months

## 2014-10-17 ENCOUNTER — Ambulatory Visit (INDEPENDENT_AMBULATORY_CARE_PROVIDER_SITE_OTHER): Payer: Medicare Other | Admitting: Internal Medicine

## 2014-10-17 VITALS — BP 152/75 | HR 61 | Temp 98.2°F | Wt 275.0 lb

## 2014-10-17 DIAGNOSIS — Z21 Asymptomatic human immunodeficiency virus [HIV] infection status: Secondary | ICD-10-CM

## 2014-10-17 LAB — COMPLETE METABOLIC PANEL WITH GFR
ALT: 16 U/L (ref 0–35)
AST: 15 U/L (ref 0–37)
Albumin: 2.9 g/dL — ABNORMAL LOW (ref 3.5–5.2)
Alkaline Phosphatase: 75 U/L (ref 39–117)
BUN: 17 mg/dL (ref 6–23)
CALCIUM: 8.2 mg/dL — AB (ref 8.4–10.5)
CHLORIDE: 100 meq/L (ref 96–112)
CO2: 32 mEq/L (ref 19–32)
Creat: 1.3 mg/dL — ABNORMAL HIGH (ref 0.50–1.10)
GFR, Est African American: 49 mL/min — ABNORMAL LOW
GFR, Est Non African American: 43 mL/min — ABNORMAL LOW
GLUCOSE: 174 mg/dL — AB (ref 70–99)
Potassium: 4.1 mEq/L (ref 3.5–5.3)
Sodium: 138 mEq/L (ref 135–145)
Total Bilirubin: 0.4 mg/dL (ref 0.2–1.2)
Total Protein: 6.1 g/dL (ref 6.0–8.3)

## 2014-10-17 LAB — CBC WITH DIFFERENTIAL/PLATELET
Basophils Absolute: 0 10*3/uL (ref 0.0–0.1)
Basophils Relative: 0 % (ref 0–1)
EOS PCT: 1 % (ref 0–5)
Eosinophils Absolute: 0.1 10*3/uL (ref 0.0–0.7)
HCT: 35.8 % — ABNORMAL LOW (ref 36.0–46.0)
HEMOGLOBIN: 11.3 g/dL — AB (ref 12.0–15.0)
LYMPHS ABS: 0.7 10*3/uL (ref 0.7–4.0)
LYMPHS PCT: 13 % (ref 12–46)
MCH: 28.5 pg (ref 26.0–34.0)
MCHC: 31.6 g/dL (ref 30.0–36.0)
MCV: 90.4 fL (ref 78.0–100.0)
MONO ABS: 0.4 10*3/uL (ref 0.1–1.0)
MONOS PCT: 7 % (ref 3–12)
MPV: 11.2 fL (ref 9.4–12.4)
Neutro Abs: 4.1 10*3/uL (ref 1.7–7.7)
Neutrophils Relative %: 79 % — ABNORMAL HIGH (ref 43–77)
Platelets: 282 10*3/uL (ref 150–400)
RBC: 3.96 MIL/uL (ref 3.87–5.11)
RDW: 17.9 % — ABNORMAL HIGH (ref 11.5–15.5)
WBC: 5.2 10*3/uL (ref 4.0–10.5)

## 2014-10-17 NOTE — Progress Notes (Signed)
Patient ID: Rachael Santana, female   DOB: Feb 06, 1946, 68 y.o.   MRN: [id number]       Patient ID: Rachael AngerBrenda Santana, female   DOB: Feb 06, 1946, 68 y.o.   MRN: [id number]  HPI 68yo F with HIV, HTN, CAD, HLD, IDDM. She has well controlled HIV disease, recently admitted on 11/19 for hypoglycemia/AMS with BS 33. She was hospitalized for 4 days, discharge with home health rn to help with medication dispensing and PT. In the last week, she has noticed being dizzy with activity. Has fallen twice, unprovoked, no loss of consciousness. Has not seen pcp. Has been getting evaluated by pulmonary for OSA.  Outpatient Encounter Prescriptions as of 10/17/2014  Medication Sig  . atorvastatin (LIPITOR) 20 MG tablet Take 1 tablet (20 mg total) by mouth daily.  . carvedilol (COREG) 25 MG tablet Take 2 tablets (50 mg total) by mouth 2 (two) times daily with a meal.  . clopidogrel (PLAVIX) 75 MG tablet Take 75 mg by mouth daily.   . Darunavir Ethanolate (PREZISTA) 800 MG tablet Take 1 tablet (800 mg total) by mouth daily with breakfast.  . doxycycline (VIBRA-TABS) 100 MG tablet Take 1 tablet (100 mg total) by mouth 2 (two) times daily.  Marland Kitchen. emtricitabine-tenofovir (TRUVADA) 200-300 MG per tablet Take 1 tablet by mouth daily.  . famotidine (PEPCID) 40 MG tablet Take 40 mg by mouth daily.  Marland Kitchen. FLUoxetine (PROZAC) 20 MG tablet Take 3 tablets (60 mg total) by mouth daily.  . furosemide (LASIX) 20 MG tablet Take 20 mg by mouth daily.   Marland Kitchen. GLIPIZIDE XL 10 MG 24 hr tablet Take 10 mg by mouth daily.   Marland Kitchen. JANUVIA 100 MG tablet Take 100 mg by mouth daily.   Marland Kitchen. LANTUS 100 UNIT/ML injection Inject 120 Units into the skin daily.   . ramipril (ALTACE) 10 MG capsule Take 2 capsules (20 mg total) by mouth daily.  . ritonavir (NORVIR) 100 MG capsule Take 1 capsule (100 mg total) by mouth daily.     Patient Active Problem List   Diagnosis Date Noted  . Unspecified hypothyroidism 06/06/2014  . Cocaine abuse 06/06/2014  . Depression 05/21/2014    . CAD S/P percutaneous coronary angioplasty 09/06/2013  . Chest pain 09/06/2013  . Hypertension   . HIV (human immunodeficiency virus infection)   . Diabetes mellitus without complication   . High cholesterol      Health Maintenance Due  Topic Date Due  . FOOT EXAM  01/28/1958  . OPHTHALMOLOGY EXAM  01/28/1958  . URINE MICROALBUMIN  01/28/1958  . TETANUS/TDAP  01/29/1967  . MAMMOGRAM  01/28/1998  . COLONOSCOPY  01/28/1998  . ZOSTAVAX  01/29/2008  . HEMOGLOBIN A1C  01/23/2009  . INFLUENZA VACCINE  06/15/2014     Review of Systems Positive pertinents listed in hpi Physical Exam   BP 152/75 mmHg  Pulse 61  Temp(Src) 98.2 F (36.8 C) (Oral)  Wt 275 lb (124.739 kg)  SpO2 91% Physical Exam  Constitutional:  oriented to person, place, and time. appears well-developed and well-nourished. No distress.  HENT:  Mouth/Throat: Oropharynx is clear and moist. No oropharyngeal exudate.  Cardiovascular: Normal rate, regular rhythm and normal heart sounds. Exam reveals no gallop and no friction rub.  No murmur heard.  Pulmonary/Chest: Effort normal and breath sounds normal. No respiratory distress.  has no wheezes.  Abdominal: Soft. Bowel sounds are normal.  exhibits no distension. There is no tenderness.  Lymphadenopathy: no cervical adenopathy.  Neurological: alert and oriented to person,  place, and time.  Skin: Skin is warm and dry. No rash noted. No erythema.  Psychiatric: a normal mood and affect. His behavior is normal.   Lab Results  Component Value Date   CD4TCELL 36 05/21/2014   Lab Results  Component Value Date   CD4TABS 530 05/21/2014   Lab Results  Component Value Date   HIV1RNAQUANT <20 05/21/2014   Lab Results  Component Value Date   HEPBSAB POS* 05/21/2014   No results found for: RPR  CBC Lab Results  Component Value Date   WBC 5.6 05/21/2014   RBC 4.17 05/21/2014   HGB 12.3 05/21/2014   HCT 35.8* 05/21/2014   PLT 266 05/21/2014   MCV 85.9  05/21/2014   MCH 29.5 05/21/2014   MCHC 34.4 05/21/2014   RDW 15.6* 05/21/2014   LYMPHSABS 1.3 05/21/2014   MONOABS 0.5 05/21/2014   EOSABS 0.1 05/21/2014   BASOSABS 0.0 05/21/2014   BMET Lab Results  Component Value Date   NA 138 05/21/2014   K 4.9 05/21/2014   CL 99 05/21/2014   CO2 31 05/21/2014   GLUCOSE 94 05/21/2014   BUN 23 05/21/2014   CREATININE 1.44* 05/21/2014   CALCIUM 8.9 05/21/2014   GFRNONAA 38* 05/21/2014   GFRAA 44* 05/21/2014     Assessment and Plan  Dizziness = unclear if it related to orthostatic hypotension. Today's visit reveal HR in 60s. Can try to change carvedilol dose briefly the next week to see if any improvement in symptoms. Will ask her to follow up with PCP in the next 7 days. May need to be referred to cardiology as well since no recent folow up.  - will ask her to do a trial of carvedilol 25mg  1.5tabs BID instead of 2 tabs BID to see if any improvement in symptoms. In meantime, have follow up with PCP. Defer to her primary doc to refer to cardiology.  hiv = well controlled. Will check tcell and hiv vl. Continue on current regimen  Ckd= appears at her baseline , will continue to follow  Dm = appears not having further hypoglycemia. Nadir most recently in the 110s.

## 2014-10-18 LAB — T-HELPER CELL (CD4) - (RCID CLINIC ONLY)
CD4 % Helper T Cell: 32 % — ABNORMAL LOW (ref 33–55)
CD4 T Cell Abs: 230 /uL — ABNORMAL LOW (ref 400–2700)

## 2014-10-19 LAB — HIV-1 RNA QUANT-NO REFLEX-BLD: HIV-1 RNA Quant, Log: <1.3 {Log} (ref <1.30)

## 2014-10-30 ENCOUNTER — Telehealth: Payer: Self-pay | Admitting: *Deleted

## 2014-10-30 NOTE — Telephone Encounter (Signed)
Per Dr Osvaldo HumanSnide called the patient to check on her dizziness. She advised she is fine now no longer dizzy but is having trouble breathing. Advised her to call her PCP about this asap and if she feels lightheaded or chest tightness to call 911.

## 2014-11-11 IMAGING — CR DG CHEST 2V
2 series · 2 of 2 positions shown · non-contrast
Comparison: 09/06/2013

CLINICAL DATA: Chest

EXAM:
CHEST  2 VIEW

[w chest pa]
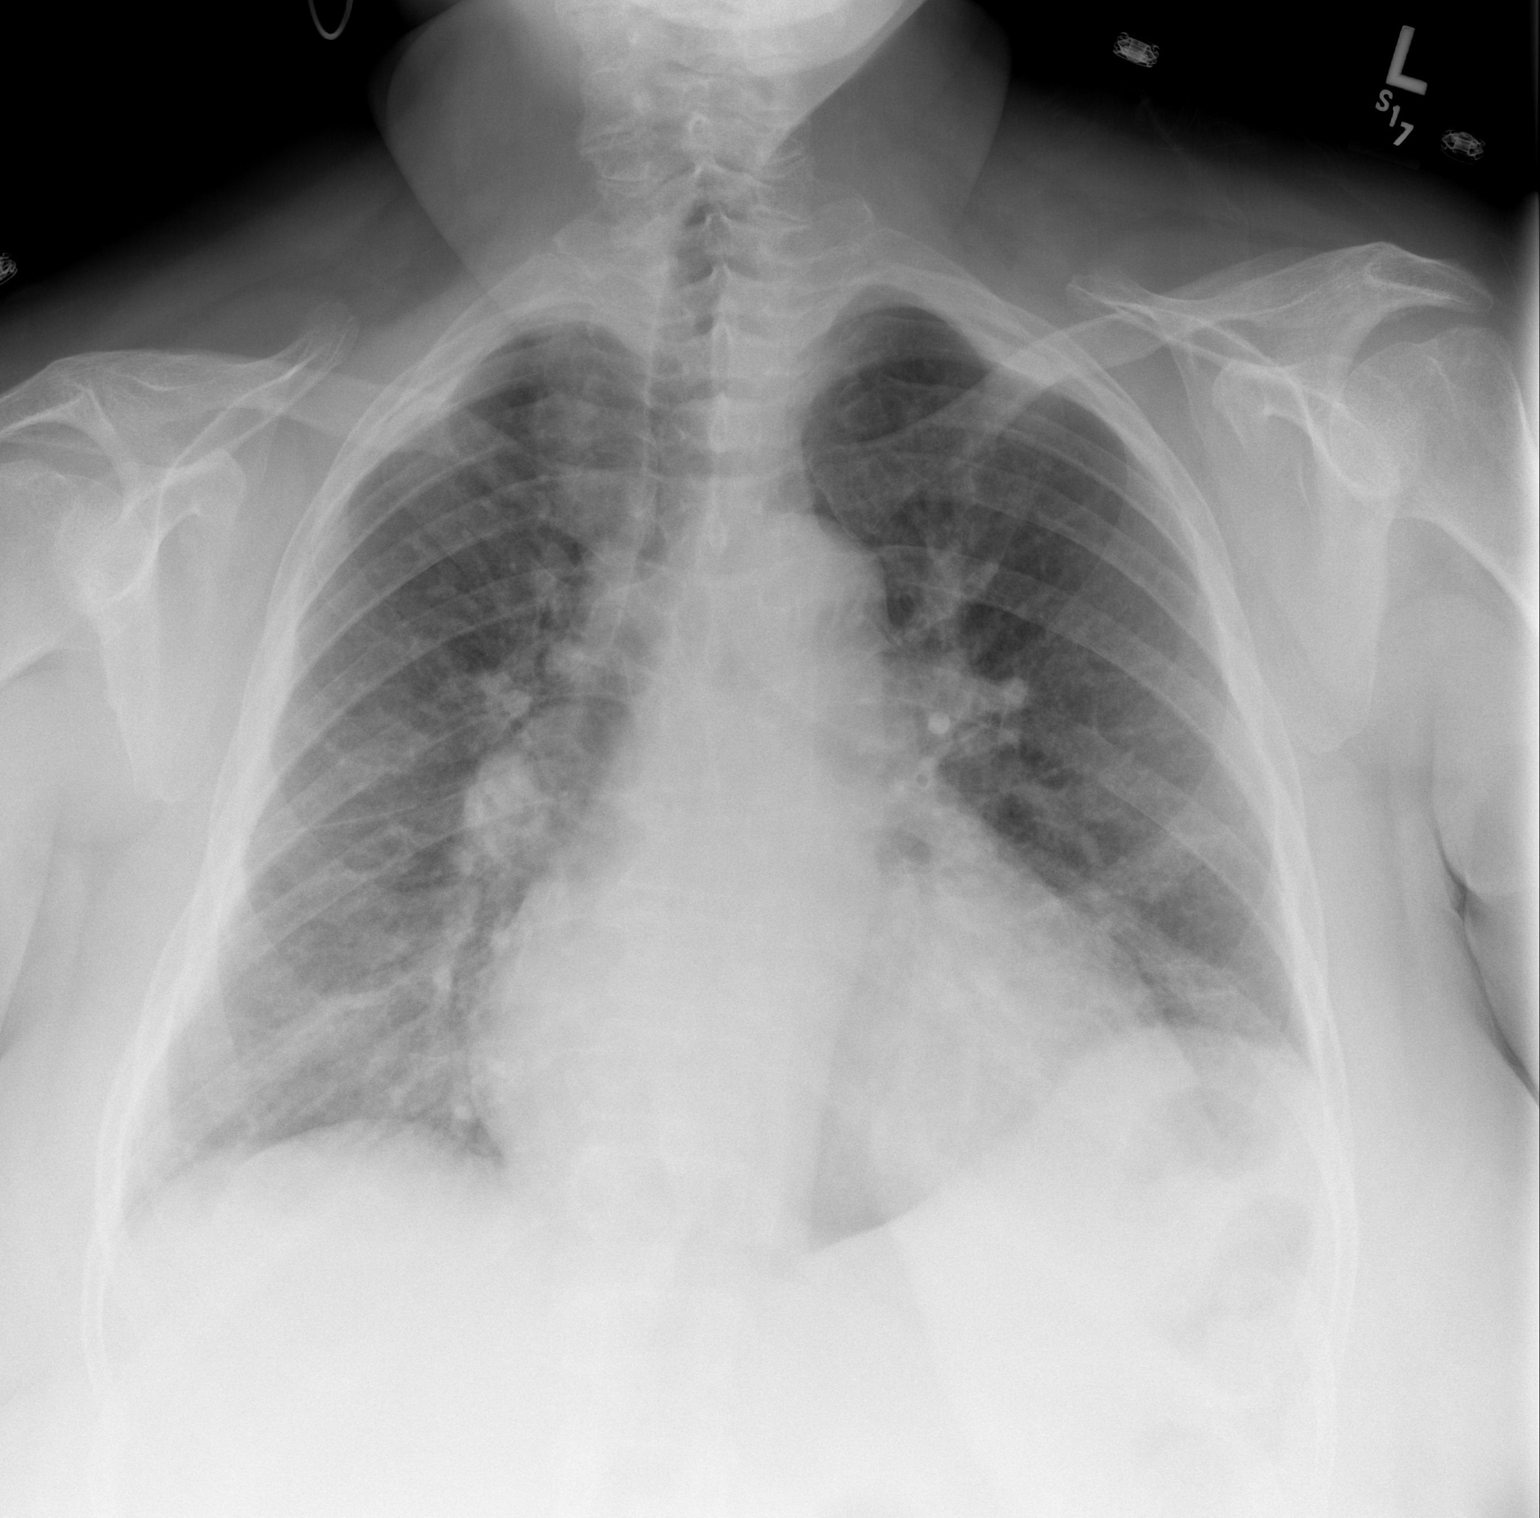

[w chest lat]
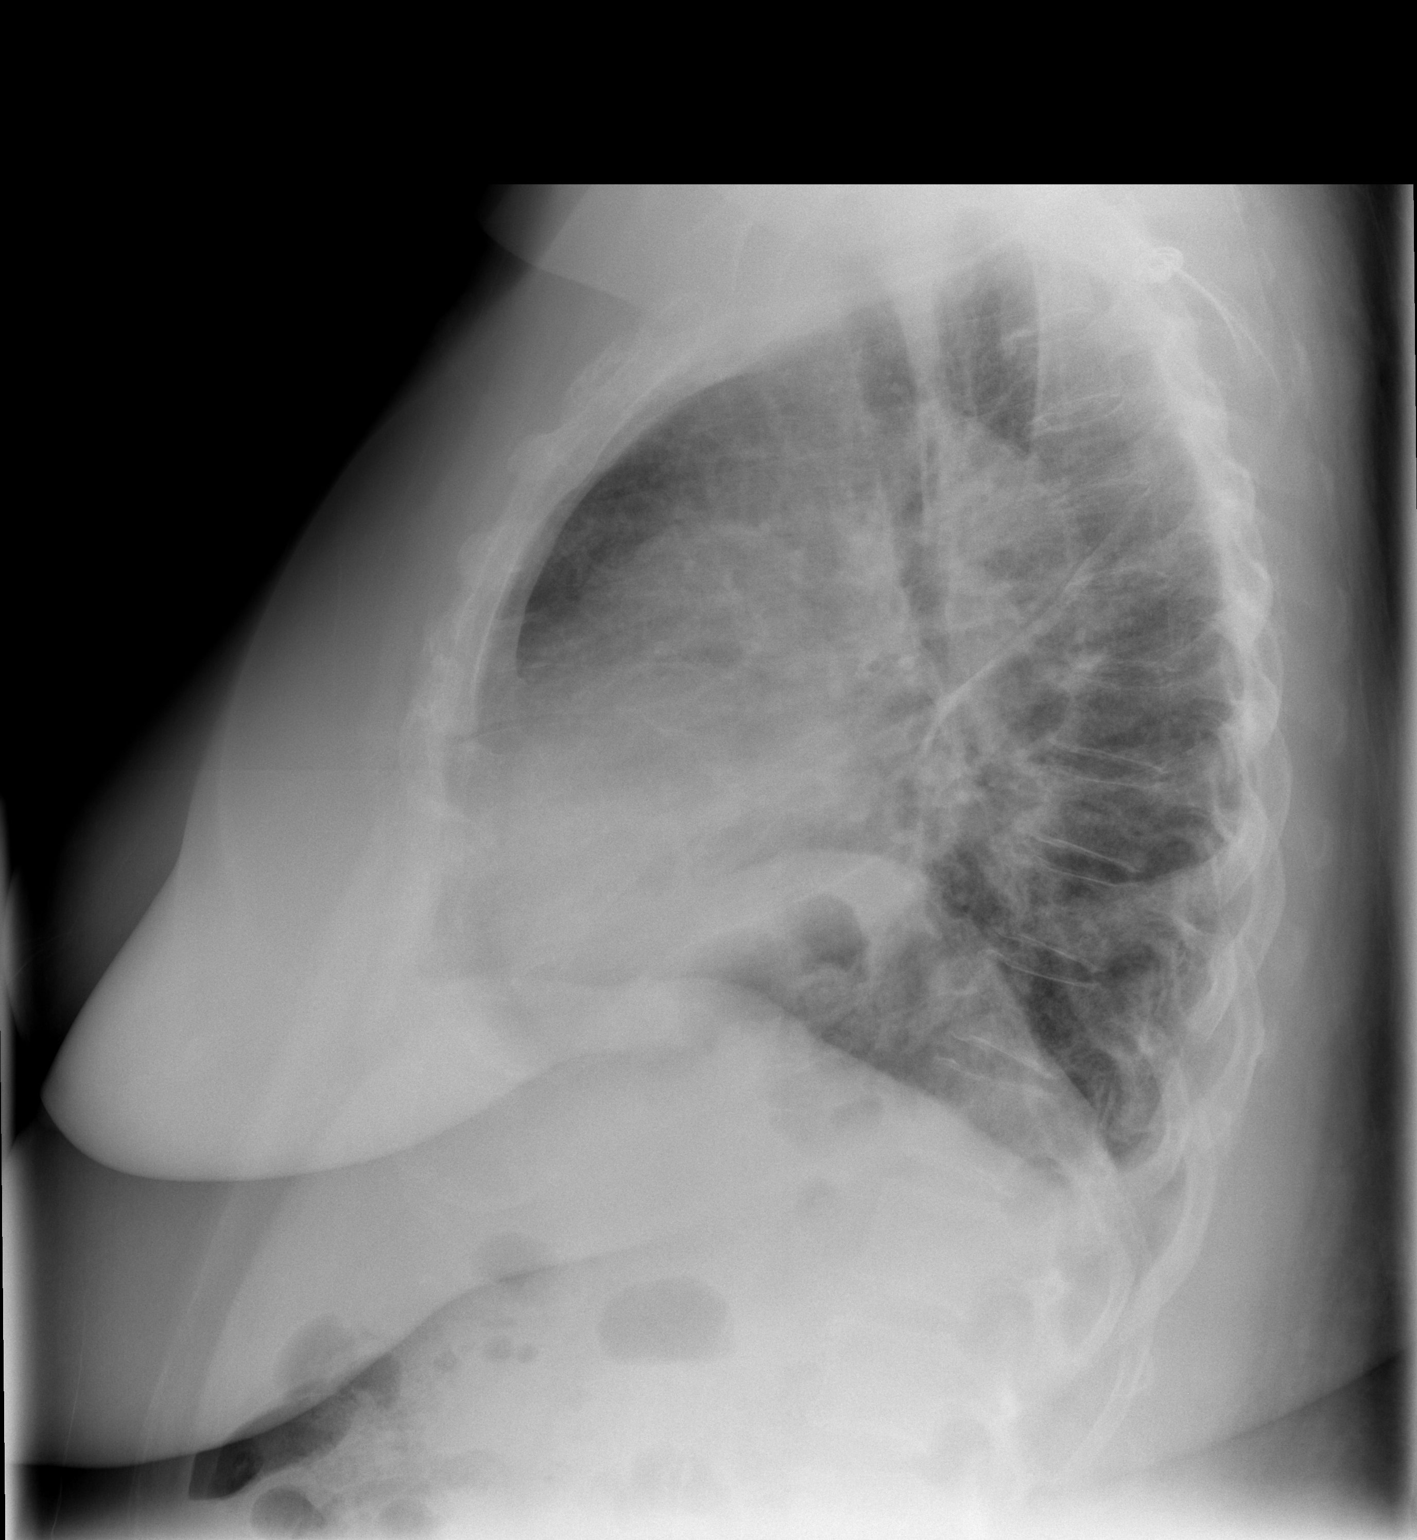

[2 of 2 positions shown; findings below may reference images not displayed]

FINDINGS: Cardiomegaly is again noted but stable. Mild vascular congestion is
seen. No focal confluent infiltrate is noted. No acute bony
abnormality is noted.
IMPRESSION: Mild congestive failure.

## 2015-01-16 ENCOUNTER — Ambulatory Visit: Payer: Medicare Other | Admitting: Internal Medicine

## 2015-03-28 ENCOUNTER — Telehealth: Payer: Self-pay | Admitting: *Deleted

## 2015-03-28 NOTE — Telephone Encounter (Signed)
Per patient's bridge counselor [facility]Cassandra Heflin, patient currently in [facility] for left below the knee amputation. Andree CossHowell, Michelle M, RN

## 2015-09-16 DEATH — deceased

## 2022-09-15 DEATH — deceased
